# Patient Record
Sex: Male | Born: 1945 | Race: White | Hispanic: No | Marital: Married | Smoking: Former smoker
Health system: Southern US, Community
[De-identification: ages and names within clinical notes are randomized; demographics above are authoritative.]

## PROBLEM LIST (undated history)

## (undated) DIAGNOSIS — S71139A Puncture wound without foreign body, unspecified thigh, initial encounter: Secondary | ICD-10-CM

## (undated) DIAGNOSIS — C801 Malignant (primary) neoplasm, unspecified: Secondary | ICD-10-CM

## (undated) DIAGNOSIS — J449 Chronic obstructive pulmonary disease, unspecified: Secondary | ICD-10-CM

## (undated) DIAGNOSIS — S72142A Displaced intertrochanteric fracture of left femur, initial encounter for closed fracture: Secondary | ICD-10-CM

## (undated) DIAGNOSIS — M1990 Unspecified osteoarthritis, unspecified site: Secondary | ICD-10-CM

## (undated) DIAGNOSIS — J45909 Unspecified asthma, uncomplicated: Secondary | ICD-10-CM

## (undated) DIAGNOSIS — W3400XA Accidental discharge from unspecified firearms or gun, initial encounter: Secondary | ICD-10-CM

## (undated) DIAGNOSIS — S99929A Unspecified injury of unspecified foot, initial encounter: Secondary | ICD-10-CM

## (undated) HISTORY — PX: NASAL SINUS SURGERY: SHX719

## (undated) HISTORY — PX: BACK SURGERY: SHX140

## (undated) HISTORY — PX: HIP SURGERY: SHX245

## (undated) HISTORY — PX: OTHER SURGICAL HISTORY: SHX169

---

## 2004-10-16 ENCOUNTER — Ambulatory Visit: Payer: Self-pay

## 2004-11-03 ENCOUNTER — Ambulatory Visit: Payer: Self-pay

## 2005-06-16 ENCOUNTER — Ambulatory Visit: Payer: Self-pay | Admitting: Ophthalmology

## 2007-11-27 ENCOUNTER — Other Ambulatory Visit: Payer: Self-pay

## 2007-11-27 ENCOUNTER — Emergency Department: Payer: Self-pay | Admitting: Emergency Medicine

## 2008-03-21 ENCOUNTER — Ambulatory Visit: Payer: Self-pay | Admitting: Urology

## 2008-03-26 ENCOUNTER — Ambulatory Visit: Payer: Self-pay | Admitting: Urology

## 2009-02-22 ENCOUNTER — Ambulatory Visit: Payer: Self-pay | Admitting: Internal Medicine

## 2009-05-30 ENCOUNTER — Ambulatory Visit: Payer: Self-pay | Admitting: Physician Assistant

## 2009-06-11 ENCOUNTER — Ambulatory Visit: Payer: Self-pay | Admitting: Pain Medicine

## 2009-06-25 ENCOUNTER — Ambulatory Visit: Payer: Self-pay | Admitting: Pain Medicine

## 2009-07-16 ENCOUNTER — Ambulatory Visit: Payer: Self-pay | Admitting: Pain Medicine

## 2009-07-30 ENCOUNTER — Ambulatory Visit: Payer: Self-pay | Admitting: Pain Medicine

## 2014-01-31 DIAGNOSIS — I10 Essential (primary) hypertension: Secondary | ICD-10-CM | POA: Insufficient documentation

## 2014-01-31 DIAGNOSIS — J449 Chronic obstructive pulmonary disease, unspecified: Secondary | ICD-10-CM | POA: Insufficient documentation

## 2014-01-31 DIAGNOSIS — E785 Hyperlipidemia, unspecified: Secondary | ICD-10-CM | POA: Insufficient documentation

## 2014-02-27 DIAGNOSIS — G479 Sleep disorder, unspecified: Secondary | ICD-10-CM | POA: Insufficient documentation

## 2014-02-27 DIAGNOSIS — N400 Enlarged prostate without lower urinary tract symptoms: Secondary | ICD-10-CM | POA: Insufficient documentation

## 2014-07-05 ENCOUNTER — Inpatient Hospital Stay (HOSPITAL_COMMUNITY)
Admission: EM | Admit: 2014-07-05 | Discharge: 2014-07-10 | DRG: 481 | Disposition: A | Discharge status: Home Health | Payer: Medicare Other | Attending: Family Medicine | Admitting: Family Medicine

## 2014-07-05 ENCOUNTER — Encounter (HOSPITAL_COMMUNITY): Payer: Self-pay | Admitting: Emergency Medicine

## 2014-07-05 ENCOUNTER — Emergency Department (HOSPITAL_COMMUNITY): Payer: Medicare Other

## 2014-07-05 DIAGNOSIS — S72142A Displaced intertrochanteric fracture of left femur, initial encounter for closed fracture: Principal | ICD-10-CM | POA: Diagnosis present

## 2014-07-05 DIAGNOSIS — Z8546 Personal history of malignant neoplasm of prostate: Secondary | ICD-10-CM

## 2014-07-05 DIAGNOSIS — R042 Hemoptysis: Secondary | ICD-10-CM | POA: Diagnosis present

## 2014-07-05 DIAGNOSIS — M810 Age-related osteoporosis without current pathological fracture: Secondary | ICD-10-CM | POA: Diagnosis present

## 2014-07-05 DIAGNOSIS — Z87891 Personal history of nicotine dependence: Secondary | ICD-10-CM | POA: Diagnosis not present

## 2014-07-05 DIAGNOSIS — R05 Cough: Secondary | ICD-10-CM | POA: Diagnosis present

## 2014-07-05 DIAGNOSIS — D682 Hereditary deficiency of other clotting factors: Secondary | ICD-10-CM | POA: Diagnosis present

## 2014-07-05 DIAGNOSIS — E871 Hypo-osmolality and hyponatremia: Secondary | ICD-10-CM | POA: Diagnosis present

## 2014-07-05 DIAGNOSIS — M5020 Other cervical disc displacement, unspecified cervical region: Secondary | ICD-10-CM | POA: Diagnosis present

## 2014-07-05 DIAGNOSIS — Z01810 Encounter for preprocedural cardiovascular examination: Secondary | ICD-10-CM

## 2014-07-05 DIAGNOSIS — T148XXA Other injury of unspecified body region, initial encounter: Secondary | ICD-10-CM

## 2014-07-05 DIAGNOSIS — J45901 Unspecified asthma with (acute) exacerbation: Secondary | ICD-10-CM | POA: Diagnosis present

## 2014-07-05 DIAGNOSIS — I452 Bifascicular block: Secondary | ICD-10-CM | POA: Diagnosis present

## 2014-07-05 DIAGNOSIS — M4802 Spinal stenosis, cervical region: Secondary | ICD-10-CM | POA: Diagnosis present

## 2014-07-05 DIAGNOSIS — D62 Acute posthemorrhagic anemia: Secondary | ICD-10-CM | POA: Diagnosis not present

## 2014-07-05 DIAGNOSIS — W1830XA Fall on same level, unspecified, initial encounter: Secondary | ICD-10-CM | POA: Diagnosis present

## 2014-07-05 DIAGNOSIS — R059 Cough, unspecified: Secondary | ICD-10-CM

## 2014-07-05 DIAGNOSIS — R55 Syncope and collapse: Secondary | ICD-10-CM | POA: Diagnosis present

## 2014-07-05 DIAGNOSIS — Z87828 Personal history of other (healed) physical injury and trauma: Secondary | ICD-10-CM | POA: Diagnosis not present

## 2014-07-05 DIAGNOSIS — R1310 Dysphagia, unspecified: Secondary | ICD-10-CM | POA: Diagnosis present

## 2014-07-05 DIAGNOSIS — S72002A Fracture of unspecified part of neck of left femur, initial encounter for closed fracture: Secondary | ICD-10-CM

## 2014-07-05 DIAGNOSIS — J441 Chronic obstructive pulmonary disease with (acute) exacerbation: Secondary | ICD-10-CM | POA: Diagnosis present

## 2014-07-05 DIAGNOSIS — R054 Cough syncope: Secondary | ICD-10-CM

## 2014-07-05 DIAGNOSIS — E860 Dehydration: Secondary | ICD-10-CM | POA: Diagnosis present

## 2014-07-05 HISTORY — DX: Displaced intertrochanteric fracture of left femur, initial encounter for closed fracture: S72.142A

## 2014-07-05 HISTORY — DX: Chronic obstructive pulmonary disease, unspecified: J44.9

## 2014-07-05 HISTORY — DX: Puncture wound without foreign body, unspecified thigh, initial encounter: S71.139A

## 2014-07-05 HISTORY — DX: Malignant (primary) neoplasm, unspecified: C80.1

## 2014-07-05 HISTORY — DX: Unspecified asthma, uncomplicated: J45.909

## 2014-07-05 HISTORY — DX: Accidental discharge from unspecified firearms or gun, initial encounter: W34.00XA

## 2014-07-05 HISTORY — DX: Unspecified osteoarthritis, unspecified site: M19.90

## 2014-07-05 HISTORY — DX: Unspecified injury of unspecified foot, initial encounter: S99.929A

## 2014-07-05 LAB — CBC WITH DIFFERENTIAL/PLATELET
BASOS ABS: 0 10*3/uL (ref 0.0–0.1)
Basophils Relative: 0 % (ref 0–1)
Eosinophils Absolute: 0.1 10*3/uL (ref 0.0–0.7)
Eosinophils Relative: 1 % (ref 0–5)
HCT: 39.7 % (ref 39.0–52.0)
Hemoglobin: 13.3 g/dL (ref 13.0–17.0)
LYMPHS ABS: 1.4 10*3/uL (ref 0.7–4.0)
LYMPHS PCT: 9 % — AB (ref 12–46)
MCH: 28.7 pg (ref 26.0–34.0)
MCHC: 33.5 g/dL (ref 30.0–36.0)
MCV: 85.6 fL (ref 78.0–100.0)
Monocytes Absolute: 0.9 10*3/uL (ref 0.1–1.0)
Monocytes Relative: 6 % (ref 3–12)
NEUTROS ABS: 13.1 10*3/uL — AB (ref 1.7–7.7)
Neutrophils Relative %: 84 % — ABNORMAL HIGH (ref 43–77)
PLATELETS: 266 10*3/uL (ref 150–400)
RBC: 4.64 MIL/uL (ref 4.22–5.81)
RDW: 13.8 % (ref 11.5–15.5)
WBC: 15.5 10*3/uL — AB (ref 4.0–10.5)

## 2014-07-05 LAB — TYPE AND SCREEN
ABO/RH(D): O POS
ANTIBODY SCREEN: NEGATIVE

## 2014-07-05 LAB — BASIC METABOLIC PANEL
ANION GAP: 14 (ref 5–15)
BUN: 16 mg/dL (ref 6–23)
CALCIUM: 9 mg/dL (ref 8.4–10.5)
CO2: 22 meq/L (ref 19–32)
Chloride: 100 mEq/L (ref 96–112)
Creatinine, Ser: 1.06 mg/dL (ref 0.50–1.35)
GFR calc Af Amer: 80 mL/min — ABNORMAL LOW (ref >90)
GFR calc non Af Amer: 69 mL/min — ABNORMAL LOW (ref >90)
Glucose, Bld: 107 mg/dL — ABNORMAL HIGH (ref 70–99)
POTASSIUM: 4.6 meq/L (ref 3.7–5.3)
SODIUM: 136 meq/L — AB (ref 137–147)

## 2014-07-05 LAB — ABO/RH: ABO/RH(D): O POS

## 2014-07-05 LAB — PROTIME-INR
INR: 1.11 (ref 0.00–1.49)
PROTHROMBIN TIME: 14.4 s (ref 11.6–15.2)

## 2014-07-05 IMAGING — CR DG HIP (WITH OR WITHOUT PELVIS) 2-3V*L*
1 series · 1 of 1 positions shown · non-contrast
Comparison: None.

CLINICAL DATA: Fall on left hip, left hip pain

EXAM:
LEFT HIP - COMPLETE 2+ VIEW

[t hip frog leg left]
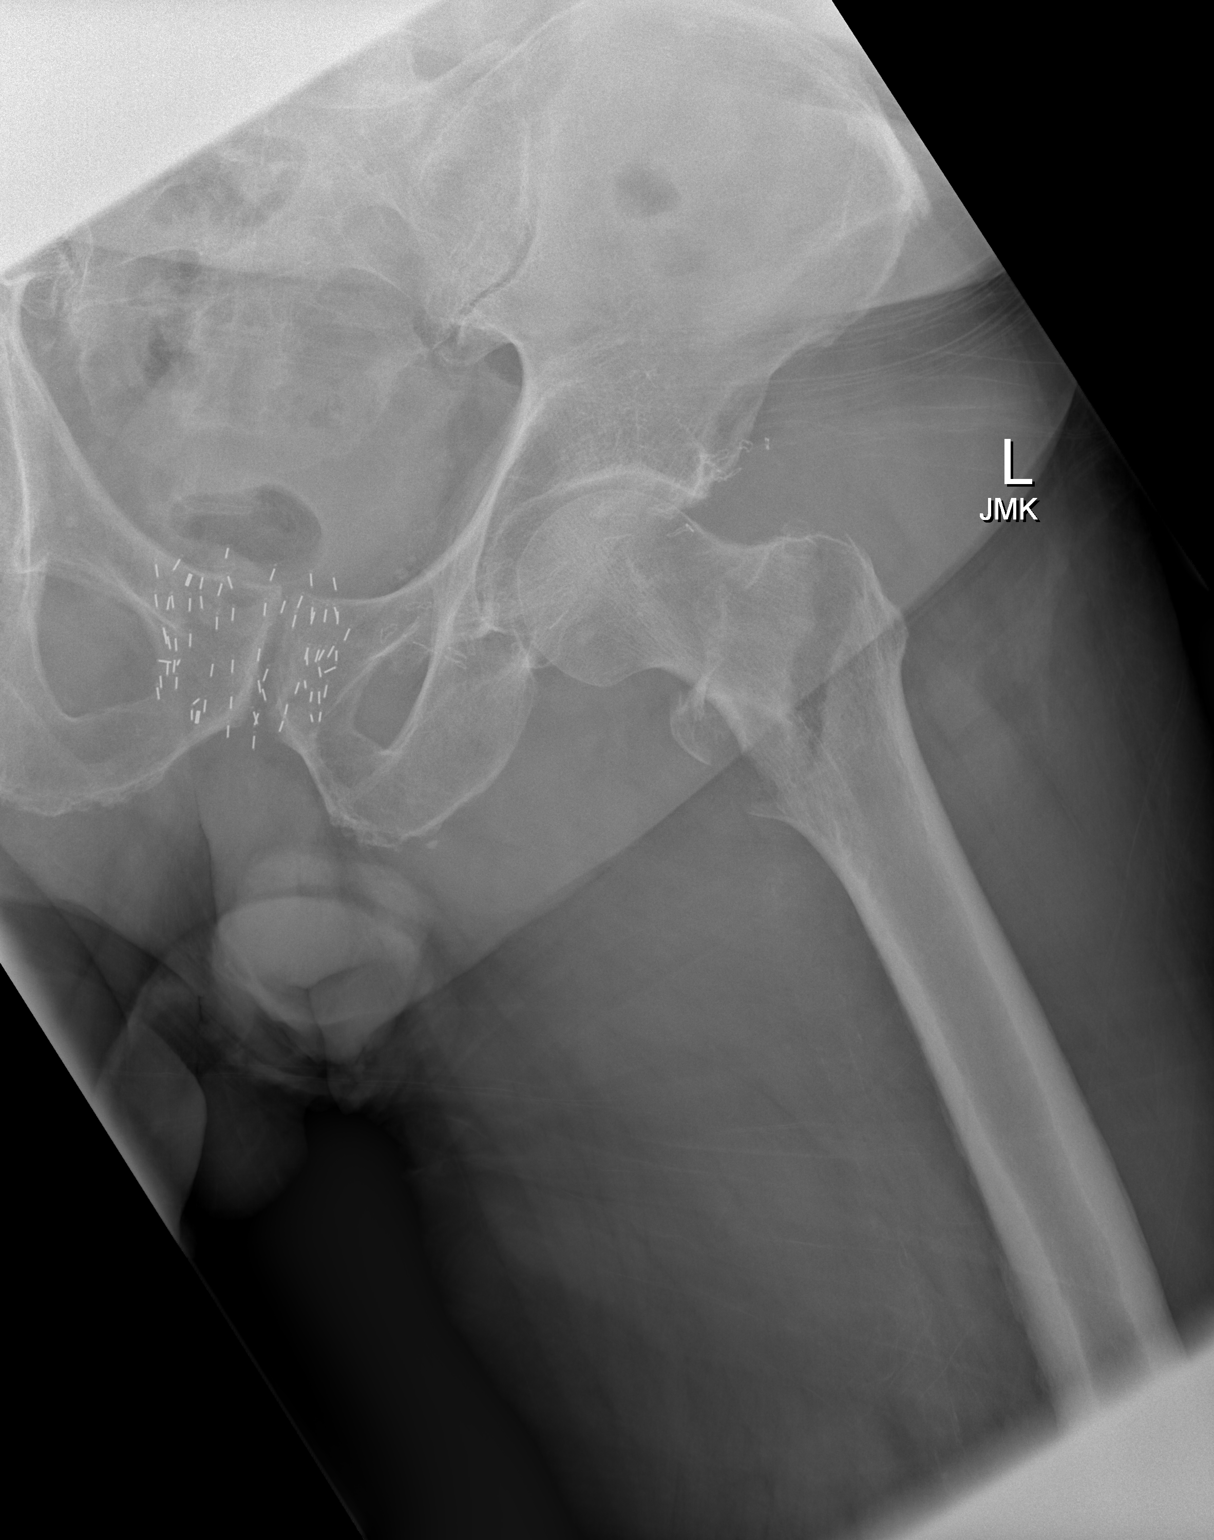

[1 of 1 positions shown; findings below may reference images not displayed]

FINDINGS: Intertrochanteric left hip fracture, minimally displaced.

Mildly displaced lesser trochanteric fragment.

Visualized bony pelvis appears intact.

Brachytherapy seeds overlying the prostate.
IMPRESSION: Intertrochanteric left hip fracture, as above.

## 2014-07-05 MED ORDER — HYDROMORPHONE HCL 1 MG/ML IJ SOLN
0.5000 mg | INTRAMUSCULAR | Status: AC | PRN
  Administered 2014-07-05 (×2): 0.5 mg via INTRAVENOUS
  Filled 2014-07-05 (×2): qty 1

## 2014-07-05 MED ORDER — ALBUTEROL SULFATE (2.5 MG/3ML) 0.083% IN NEBU
2.5000 mg | INHALATION_SOLUTION | Freq: Three times a day (TID) | RESPIRATORY_TRACT | Status: DC
  Administered 2014-07-06 – 2014-07-10 (×13): 2.5 mg via RESPIRATORY_TRACT
  Filled 2014-07-05 (×13): qty 3

## 2014-07-05 MED ORDER — MORPHINE SULFATE 4 MG/ML IJ SOLN
4.0000 mg | INTRAMUSCULAR | Status: DC | PRN
Start: 2014-07-05 — End: 2014-07-06
  Administered 2014-07-06 (×6): 4 mg via INTRAVENOUS
  Filled 2014-07-05 (×6): qty 1

## 2014-07-05 MED ORDER — DOXYCYCLINE HYCLATE 100 MG PO TABS
100.0000 mg | ORAL_TABLET | Freq: Two times a day (BID) | ORAL | Status: DC
  Administered 2014-07-06 – 2014-07-10 (×9): 100 mg via ORAL
  Filled 2014-07-05 (×11): qty 1

## 2014-07-05 MED ORDER — CEFAZOLIN SODIUM-DEXTROSE 2-3 GM-% IV SOLR
2.0000 g | INTRAVENOUS | Status: AC
  Administered 2014-07-06: 2 g via INTRAVENOUS
  Filled 2014-07-05: qty 50

## 2014-07-05 MED ORDER — SODIUM CHLORIDE 0.9 % IJ SOLN: 3.0000 mL | Freq: Two times a day (BID) | INTRAMUSCULAR | Status: DC

## 2014-07-05 MED ORDER — ONDANSETRON HCL 4 MG/2ML IJ SOLN
4.0000 mg | Freq: Four times a day (QID) | INTRAMUSCULAR | Status: DC | PRN
  Administered 2014-07-06 (×2): 4 mg via INTRAVENOUS
  Filled 2014-07-05 (×2): qty 2

## 2014-07-05 MED ORDER — SODIUM CHLORIDE 0.9 % IV SOLN
INTRAVENOUS | Status: DC
  Administered 2014-07-06: 01:00:00 via INTRAVENOUS

## 2014-07-05 MED ORDER — MONTELUKAST SODIUM 10 MG PO TABS
10.0000 mg | ORAL_TABLET | Freq: Every day | ORAL | Status: DC
  Administered 2014-07-06 – 2014-07-09 (×5): 10 mg via ORAL
  Filled 2014-07-05 (×6): qty 1

## 2014-07-05 MED ORDER — FINASTERIDE 5 MG PO TABS
5.0000 mg | ORAL_TABLET | Freq: Every evening | ORAL | Status: DC
  Administered 2014-07-06 – 2014-07-09 (×4): 5 mg via ORAL
  Filled 2014-07-05 (×6): qty 1

## 2014-07-05 MED ORDER — ONDANSETRON HCL 4 MG/2ML IJ SOLN
4.0000 mg | Freq: Once | INTRAMUSCULAR | Status: AC
  Administered 2014-07-05: 4 mg via INTRAVENOUS
  Filled 2014-07-05: qty 2

## 2014-07-05 MED ORDER — PREDNISONE 50 MG PO TABS
50.0000 mg | ORAL_TABLET | Freq: Every day | ORAL | Status: DC
  Filled 2014-07-05: qty 1

## 2014-07-05 MED ORDER — ONDANSETRON HCL 4 MG/2ML IJ SOLN
4.0000 mg | Freq: Three times a day (TID) | INTRAMUSCULAR | Status: DC | PRN
Start: 2014-07-05 — End: 2014-07-05

## 2014-07-05 MED ORDER — ALBUTEROL SULFATE (2.5 MG/3ML) 0.083% IN NEBU
2.5000 mg | INHALATION_SOLUTION | Freq: Four times a day (QID) | RESPIRATORY_TRACT | Status: DC
  Administered 2014-07-05: 2.5 mg via RESPIRATORY_TRACT
  Filled 2014-07-05: qty 3

## 2014-07-05 MED ORDER — HYDROMORPHONE HCL 1 MG/ML IJ SOLN
1.0000 mg | INTRAMUSCULAR | Status: DC | PRN
  Administered 2014-07-05: 1 mg via INTRAVENOUS
  Filled 2014-07-05: qty 1

## 2014-07-05 MED ORDER — HYDROCODONE-ACETAMINOPHEN 5-325 MG PO TABS: 1.0000 | ORAL_TABLET | ORAL | Status: DC | PRN

## 2014-07-05 MED ORDER — ALBUTEROL SULFATE (2.5 MG/3ML) 0.083% IN NEBU: 2.5000 mg | INHALATION_SOLUTION | RESPIRATORY_TRACT | Status: DC | PRN

## 2014-07-05 MED ORDER — HYDROMORPHONE HCL 1 MG/ML IJ SOLN: 1.0000 mg | INTRAMUSCULAR | Status: DC | PRN

## 2014-07-05 MED ORDER — ONDANSETRON HCL 4 MG PO TABS: 4.0000 mg | ORAL_TABLET | Freq: Four times a day (QID) | ORAL | Status: DC | PRN

## 2014-07-05 MED ORDER — DEXTROSE-NACL 5-0.45 % IV SOLN: 100.0000 mL/h | INTRAVENOUS | Status: DC

## 2014-07-05 MED ORDER — TAMSULOSIN HCL 0.4 MG PO CAPS
0.4000 mg | ORAL_CAPSULE | Freq: Every day | ORAL | Status: DC
  Administered 2014-07-07 – 2014-07-10 (×4): 0.4 mg via ORAL
  Filled 2014-07-05 (×5): qty 1

## 2014-07-05 MED ORDER — ACETAMINOPHEN 500 MG PO TABS
1000.0000 mg | ORAL_TABLET | Freq: Once | ORAL | Status: AC
  Administered 2014-07-06: 1000 mg via ORAL
  Filled 2014-07-05: qty 2

## 2014-07-05 MED ORDER — POTASSIUM CHLORIDE IN NACL 20-0.45 MEQ/L-% IV SOLN
INTRAVENOUS | Status: DC
  Filled 2014-07-05 (×2): qty 1000

## 2014-07-05 MED ORDER — GABAPENTIN 300 MG PO CAPS
600.0000 mg | ORAL_CAPSULE | Freq: Three times a day (TID) | ORAL | Status: DC
  Administered 2014-07-06 – 2014-07-10 (×12): 600 mg via ORAL
  Filled 2014-07-05 (×16): qty 2
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
## 2014-07-05 NOTE — Consult Note (Signed)
I have reviewed his films and discussed the case with Dr. Mardelle Matte.   I have spoken with the patient over the phone.   Current plan is for [facility]am [facility] to admit patient and clear for surgery tomorrow mid day.  -Bedrest overnight  -NPO at midnight -hold any anticoagulation until patient is post op.   Dr. Mardelle Matte will meet Mr. Calvin [name] tomorrow before surgery and perform his surgery.     Edmonia Lynch, D, MD Cell [phone]   07/05/2014 8:01 PM

## 2014-07-05 NOTE — ED Notes (Signed)
Per EMS, Patient had a fall this morning due to lightheadedness after a coughing episode at 10:30a. Patient was unable to get himself up. A neighbor came over and retrieved the patient around 1600. EMS was called at this point. Patient has pain to the left knee, pulses present, but patient reports extreme pain during movement or Range of Motion. EMS did not note any rotation upon arrival. Patient denies LOC and is alert and oriented x4 upon arrival to the ED. Vitals per EMS: 150/85, 108 HR, 18, 97 % on RA.

## 2014-07-05 NOTE — ED Notes (Signed)
Pt reports coughing up blood.  Blood noted on napkin. Pt sts this has been going on for 3 months.  Stated that he told admitting doctors about hemoptysis.
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
## 2014-07-06 ENCOUNTER — Encounter (HOSPITAL_COMMUNITY): Payer: Medicare Other | Admitting: Certified Registered Nurse Anesthetist

## 2014-07-06 ENCOUNTER — Encounter (HOSPITAL_COMMUNITY)
Admission: EM | Disposition: A | Discharge status: Home Health | Payer: Self-pay | Source: Home / Self Care | Attending: Family Medicine

## 2014-07-06 ENCOUNTER — Encounter (HOSPITAL_COMMUNITY): Payer: Self-pay | Admitting: Physician Assistant

## 2014-07-06 ENCOUNTER — Inpatient Hospital Stay (HOSPITAL_COMMUNITY): Payer: Medicare Other

## 2014-07-06 ENCOUNTER — Inpatient Hospital Stay (HOSPITAL_COMMUNITY): Payer: Medicare Other | Admitting: Certified Registered Nurse Anesthetist

## 2014-07-06 DIAGNOSIS — R05 Cough: Secondary | ICD-10-CM

## 2014-07-06 DIAGNOSIS — R054 Cough syncope: Secondary | ICD-10-CM | POA: Diagnosis present

## 2014-07-06 DIAGNOSIS — R042 Hemoptysis: Secondary | ICD-10-CM | POA: Diagnosis present

## 2014-07-06 DIAGNOSIS — S72002A Fracture of unspecified part of neck of left femur, initial encounter for closed fracture: Secondary | ICD-10-CM

## 2014-07-06 DIAGNOSIS — I452 Bifascicular block: Secondary | ICD-10-CM | POA: Diagnosis present

## 2014-07-06 DIAGNOSIS — M810 Age-related osteoporosis without current pathological fracture: Secondary | ICD-10-CM | POA: Diagnosis present

## 2014-07-06 DIAGNOSIS — S72142A Displaced intertrochanteric fracture of left femur, initial encounter for closed fracture: Principal | ICD-10-CM

## 2014-07-06 DIAGNOSIS — Z01810 Encounter for preprocedural cardiovascular examination: Secondary | ICD-10-CM

## 2014-07-06 HISTORY — PX: INTRAMEDULLARY (IM) NAIL INTERTROCHANTERIC: SHX5875

## 2014-07-06 LAB — COMPREHENSIVE METABOLIC PANEL
ALT: 9 U/L (ref 0–53)
ANION GAP: 9 (ref 5–15)
AST: 13 U/L (ref 0–37)
Albumin: 3.1 g/dL — ABNORMAL LOW (ref 3.5–5.2)
Alkaline Phosphatase: 63 U/L (ref 39–117)
BUN: 19 mg/dL (ref 6–23)
CALCIUM: 8.6 mg/dL (ref 8.4–10.5)
CO2: 28 mEq/L (ref 19–32)
Chloride: 102 mEq/L (ref 96–112)
Creatinine, Ser: 1.19 mg/dL (ref 0.50–1.35)
GFR calc Af Amer: 70 mL/min — ABNORMAL LOW (ref >90)
GFR calc non Af Amer: 60 mL/min — ABNORMAL LOW (ref >90)
Glucose, Bld: 137 mg/dL — ABNORMAL HIGH (ref 70–99)
POTASSIUM: 4.9 meq/L (ref 3.7–5.3)
Sodium: 139 mEq/L (ref 137–147)
TOTAL PROTEIN: 7 g/dL (ref 6.0–8.3)
Total Bilirubin: 0.5 mg/dL (ref 0.3–1.2)

## 2014-07-06 LAB — CBC
HEMATOCRIT: 39.2 % (ref 39.0–52.0)
HEMOGLOBIN: 12.8 g/dL — AB (ref 13.0–17.0)
MCH: 28.7 pg (ref 26.0–34.0)
MCHC: 32.7 g/dL (ref 30.0–36.0)
MCV: 87.9 fL (ref 78.0–100.0)
Platelets: 271 10*3/uL (ref 150–400)
RBC: 4.46 MIL/uL (ref 4.22–5.81)
RDW: 13.9 % (ref 11.5–15.5)
WBC: 11.8 10*3/uL — ABNORMAL HIGH (ref 4.0–10.5)

## 2014-07-06 SURGERY — FIXATION, FRACTURE, INTERTROCHANTERIC, WITH INTRAMEDULLARY ROD
Anesthesia: General | Site: Leg Upper | Laterality: Left

## 2014-07-06 MED ORDER — LIDOCAINE HCL (CARDIAC) 20 MG/ML IV SOLN
INTRAVENOUS | Status: AC
  Filled 2014-07-06: qty 5

## 2014-07-06 MED ORDER — ENOXAPARIN SODIUM 40 MG/0.4ML ~~LOC~~ SOLN
40.0000 mg | SUBCUTANEOUS | Status: DC
  Administered 2014-07-07 – 2014-07-10 (×4): 40 mg via SUBCUTANEOUS
  Filled 2014-07-06 (×6): qty 0.4

## 2014-07-06 MED ORDER — ACETAMINOPHEN 500 MG PO TABS: 500.0000 mg | ORAL_TABLET | Freq: Four times a day (QID) | ORAL | Status: DC

## 2014-07-06 MED ORDER — CEFAZOLIN SODIUM-DEXTROSE 2-3 GM-% IV SOLR
2.0000 g | Freq: Four times a day (QID) | INTRAVENOUS | Status: AC
  Administered 2014-07-06 – 2014-07-07 (×2): 2 g via INTRAVENOUS
  Filled 2014-07-06 (×2): qty 50

## 2014-07-06 MED ORDER — FENTANYL CITRATE 0.05 MG/ML IJ SOLN
INTRAMUSCULAR | Status: DC | PRN
  Administered 2014-07-06: 50 ug via INTRAVENOUS
  Administered 2014-07-06: 150 ug via INTRAVENOUS
  Administered 2014-07-06: 50 ug via INTRAVENOUS

## 2014-07-06 MED ORDER — PROPOFOL 10 MG/ML IV BOLUS
INTRAVENOUS | Status: DC | PRN
  Administered 2014-07-06: 130 mg via INTRAVENOUS

## 2014-07-06 MED ORDER — HYDROCODONE-ACETAMINOPHEN 10-325 MG PO TABS: 1.0000 | ORAL_TABLET | Freq: Four times a day (QID) | ORAL | Status: DC | PRN

## 2014-07-06 MED ORDER — METOCLOPRAMIDE HCL 5 MG/ML IJ SOLN: 5.0000 mg | Freq: Three times a day (TID) | INTRAMUSCULAR | Status: DC | PRN

## 2014-07-06 MED ORDER — METHYLPREDNISOLONE SODIUM SUCC 125 MG IJ SOLR: 60.0000 mg | Freq: Once | INTRAMUSCULAR | Status: DC

## 2014-07-06 MED ORDER — PHENOL 1.4 % MT LIQD
1.0000 | OROMUCOSAL | Status: DC | PRN
Start: 2014-07-06 — End: 2014-07-10

## 2014-07-06 MED ORDER — BENZONATATE 100 MG PO CAPS: 100.0000 mg | ORAL_CAPSULE | Freq: Three times a day (TID) | ORAL | Status: DC | PRN

## 2014-07-06 MED ORDER — ONDANSETRON HCL 4 MG/2ML IJ SOLN: 4.0000 mg | Freq: Once | INTRAMUSCULAR | Status: DC | PRN

## 2014-07-06 MED ORDER — MENTHOL 3 MG MT LOZG: 1.0000 | LOZENGE | OROMUCOSAL | Status: DC | PRN

## 2014-07-06 MED ORDER — BENZONATATE 100 MG PO CAPS: 100.0000 mg | ORAL_CAPSULE | Freq: Two times a day (BID) | ORAL | Status: DC

## 2014-07-06 MED ORDER — MAGNESIUM CITRATE PO SOLN: 1.0000 | Freq: Once | ORAL | Status: AC | PRN

## 2014-07-06 MED ORDER — ACETAMINOPHEN 650 MG RE SUPP: 650.0000 mg | Freq: Four times a day (QID) | RECTAL | Status: DC | PRN

## 2014-07-06 MED ORDER — ENOXAPARIN SODIUM 40 MG/0.4ML ~~LOC~~ SOLN: 40.0000 mg | SUBCUTANEOUS | Status: DC

## 2014-07-06 MED ORDER — LACTATED RINGERS IV SOLN
INTRAVENOUS | Status: DC
  Administered 2014-07-06: 16:00:00 via INTRAVENOUS

## 2014-07-06 MED ORDER — PREDNISONE 50 MG PO TABS: 50.0000 mg | ORAL_TABLET | Freq: Every day | ORAL | Status: DC

## 2014-07-06 MED ORDER — HYDROMORPHONE HCL 1 MG/ML IJ SOLN
INTRAMUSCULAR | Status: AC
  Filled 2014-07-06: qty 1

## 2014-07-06 MED ORDER — ROCURONIUM BROMIDE 50 MG/5ML IV SOLN
INTRAVENOUS | Status: AC
  Filled 2014-07-06: qty 1

## 2014-07-06 MED ORDER — POLYETHYLENE GLYCOL 3350 17 G PO PACK
17.0000 g | PACK | Freq: Every day | ORAL | Status: DC | PRN
Start: 2014-07-06 — End: 2014-07-09
  Administered 2014-07-08: 17 g via ORAL

## 2014-07-06 MED ORDER — METOCLOPRAMIDE HCL 10 MG PO TABS
5.0000 mg | ORAL_TABLET | Freq: Three times a day (TID) | ORAL | Status: DC | PRN
  Administered 2014-07-08 – 2014-07-09 (×2): 10 mg via ORAL
  Filled 2014-07-06 (×2): qty 1

## 2014-07-06 MED ORDER — MORPHINE SULFATE 2 MG/ML IJ SOLN
0.5000 mg | INTRAMUSCULAR | Status: DC | PRN
  Filled 2014-07-06: qty 1

## 2014-07-06 MED ORDER — ONDANSETRON HCL 4 MG PO TABS
4.0000 mg | ORAL_TABLET | Freq: Four times a day (QID) | ORAL | Status: DC | PRN
Start: 2014-07-06 — End: 2014-07-10

## 2014-07-06 MED ORDER — PROPOFOL 10 MG/ML IV BOLUS
INTRAVENOUS | Status: AC
  Filled 2014-07-06: qty 20

## 2014-07-06 MED ORDER — SENNA 8.6 MG PO TABS
2.0000 | ORAL_TABLET | Freq: Every day | ORAL | Status: DC
  Filled 2014-07-06: qty 2

## 2014-07-06 MED ORDER — ONDANSETRON HCL 4 MG/2ML IJ SOLN
4.0000 mg | Freq: Four times a day (QID) | INTRAMUSCULAR | Status: DC | PRN
  Administered 2014-07-07 (×2): 4 mg via INTRAVENOUS
  Filled 2014-07-06 (×2): qty 2

## 2014-07-06 MED ORDER — PHENYLEPHRINE 40 MCG/ML (10ML) SYRINGE FOR IV PUSH (FOR BLOOD PRESSURE SUPPORT)
PREFILLED_SYRINGE | INTRAVENOUS | Status: AC
  Filled 2014-07-06: qty 10

## 2014-07-06 MED ORDER — PREDNISONE 50 MG PO TABS
50.0000 mg | ORAL_TABLET | Freq: Every day | ORAL | Status: DC
  Administered 2014-07-06 – 2014-07-10 (×5): 50 mg via ORAL
  Filled 2014-07-06 (×6): qty 1

## 2014-07-06 MED ORDER — DOCUSATE SODIUM 100 MG PO CAPS
100.0000 mg | ORAL_CAPSULE | Freq: Two times a day (BID) | ORAL | Status: DC
  Administered 2014-07-06 – 2014-07-10 (×8): 100 mg via ORAL
  Filled 2014-07-06 (×8): qty 1

## 2014-07-06 MED ORDER — LIDOCAINE HCL (CARDIAC) 20 MG/ML IV SOLN
INTRAVENOUS | Status: DC | PRN
  Administered 2014-07-06: 80 mg via INTRAVENOUS

## 2014-07-06 MED ORDER — SENNA-DOCUSATE SODIUM 8.6-50 MG PO TABS: 2.0000 | ORAL_TABLET | Freq: Every day | ORAL | Status: DC

## 2014-07-06 MED ORDER — DEXAMETHASONE SODIUM PHOSPHATE 10 MG/ML IJ SOLN
INTRAMUSCULAR | Status: AC
  Filled 2014-07-06: qty 1

## 2014-07-06 MED ORDER — ROCURONIUM BROMIDE 100 MG/10ML IV SOLN
INTRAVENOUS | Status: DC | PRN
  Administered 2014-07-06: 30 mg via INTRAVENOUS

## 2014-07-06 MED ORDER — FENTANYL CITRATE 0.05 MG/ML IJ SOLN
INTRAMUSCULAR | Status: AC
  Filled 2014-07-06: qty 5

## 2014-07-06 MED ORDER — MOMETASONE FURO-FORMOTEROL FUM 100-5 MCG/ACT IN AERO
2.0000 | INHALATION_SPRAY | Freq: Two times a day (BID) | RESPIRATORY_TRACT | Status: DC
  Administered 2014-07-06 – 2014-07-10 (×8): 2 via RESPIRATORY_TRACT
  Filled 2014-07-06: qty 8.8

## 2014-07-06 MED ORDER — ACETAMINOPHEN 325 MG PO TABS: 650.0000 mg | ORAL_TABLET | Freq: Four times a day (QID) | ORAL | Status: DC | PRN

## 2014-07-06 MED ORDER — BISACODYL 10 MG RE SUPP
10.0000 mg | Freq: Every day | RECTAL | Status: DC | PRN
  Filled 2014-07-06: qty 1

## 2014-07-06 MED ORDER — HYDROMORPHONE HCL 1 MG/ML IJ SOLN
0.2500 mg | INTRAMUSCULAR | Status: DC | PRN
  Administered 2014-07-06 (×4): 0.5 mg via INTRAVENOUS

## 2014-07-06 MED ORDER — 0.9 % SODIUM CHLORIDE (POUR BTL) OPTIME
TOPICAL | Status: DC | PRN
  Administered 2014-07-06: 1000 mL

## 2014-07-06 MED ORDER — SUCCINYLCHOLINE CHLORIDE 20 MG/ML IJ SOLN
INTRAMUSCULAR | Status: AC
  Filled 2014-07-06: qty 1

## 2014-07-06 MED ORDER — POTASSIUM CHLORIDE IN NACL 20-0.45 MEQ/L-% IV SOLN
INTRAVENOUS | Status: DC
  Administered 2014-07-06: 22:00:00 via INTRAVENOUS
  Filled 2014-07-06 (×6): qty 1000

## 2014-07-06 MED ORDER — ALUM & MAG HYDROXIDE-SIMETH 200-200-20 MG/5ML PO SUSP
30.0000 mL | ORAL | Status: DC | PRN
  Administered 2014-07-07 – 2014-07-08 (×3): 30 mL via ORAL
  Filled 2014-07-06 (×3): qty 30

## 2014-07-06 MED ORDER — OXYCODONE HCL 5 MG PO TABS: 5.0000 mg | ORAL_TABLET | ORAL | Status: DC | PRN

## 2014-07-06 MED ORDER — SENNA 8.6 MG PO TABS
1.0000 | ORAL_TABLET | Freq: Two times a day (BID) | ORAL | Status: DC
  Administered 2014-07-06 – 2014-07-08 (×5): 8.6 mg via ORAL
  Filled 2014-07-06 (×7): qty 1

## 2014-07-06 MED ORDER — HYDROCODONE-ACETAMINOPHEN 5-325 MG PO TABS
1.0000 | ORAL_TABLET | Freq: Four times a day (QID) | ORAL | Status: DC | PRN
  Administered 2014-07-06 – 2014-07-10 (×14): 2 via ORAL
  Filled 2014-07-06 (×14): qty 2

## 2014-07-06 SURGICAL SUPPLY — 44 items
BENZOIN TINCTURE PRP APPL 2/3 (GAUZE/BANDAGES/DRESSINGS) ×3 IMPLANT
BIT DRILL CANN 16 HIP (BIT) ×2 IMPLANT
BIT DRILL CANN 16MM HIP (BIT) ×1
BLADE TFNA HELICAL 110 (Anchor) ×2 IMPLANT
BLADE TFNA HELICAL 110MM (Anchor) ×1 IMPLANT
BOOTCOVER CLEANROOM LRG (PROTECTIVE WEAR) ×6 IMPLANT
CLOSURE STERI-STRIP 1/2X4 (GAUZE/BANDAGES/DRESSINGS) ×1
CLSR STERI-STRIP ANTIMIC 1/2X4 (GAUZE/BANDAGES/DRESSINGS) ×2 IMPLANT
COVER PERINEAL POST (MISCELLANEOUS) ×3 IMPLANT
COVER SURGICAL LIGHT HANDLE (MISCELLANEOUS) ×3 IMPLANT
DRAPE STERI IOBAN 125X83 (DRAPES) ×3 IMPLANT
DRSG MEPILEX BORDER 4X4 (GAUZE/BANDAGES/DRESSINGS) ×6 IMPLANT
DRSG MEPILEX BORDER 4X8 (GAUZE/BANDAGES/DRESSINGS) ×6 IMPLANT
DURAPREP 26ML APPLICATOR (WOUND CARE) ×3 IMPLANT
ELECT CAUTERY BLADE 6.4 (BLADE) ×3 IMPLANT
ELECT REM PT RETURN 9FT ADLT (ELECTROSURGICAL) ×3
ELECTRODE REM PT RTRN 9FT ADLT (ELECTROSURGICAL) ×1 IMPLANT
EVACUATOR 1/8 PVC DRAIN (DRAIN) IMPLANT
FACESHIELD WRAPAROUND (MASK) ×6 IMPLANT
GAUZE XEROFORM 5X9 LF (GAUZE/BANDAGES/DRESSINGS) ×3 IMPLANT
GLOVE BIOGEL PI ORTHO PRO SZ8 (GLOVE) ×2
GLOVE ORTHO TXT STRL SZ7.5 (GLOVE) ×6 IMPLANT
GLOVE PI ORTHO PRO STRL SZ8 (GLOVE) ×1 IMPLANT
GLOVE SURG ORTHO 8.0 STRL STRW (GLOVE) ×6 IMPLANT
GOWN STRL REUS W/ TWL LRG LVL3 (GOWN DISPOSABLE) IMPLANT
GOWN STRL REUS W/TWL LRG LVL3 (GOWN DISPOSABLE)
GUIDEWIRE 3.2X400 (WIRE) ×3 IMPLANT
KIT ROOM TURNOVER OR (KITS) ×3 IMPLANT
LINER BOOT UNIVERSAL DISP (MISCELLANEOUS) ×3 IMPLANT
MANIFOLD NEPTUNE II (INSTRUMENTS) ×3 IMPLANT
NAIL CANN TFNA 11X130X380 LEFT (Nail) ×3 IMPLANT
NS IRRIG 1000ML POUR BTL (IV SOLUTION) ×3 IMPLANT
PACK GENERAL/GYN (CUSTOM PROCEDURE TRAY) ×3 IMPLANT
PAD ARMBOARD 7.5X6 YLW CONV (MISCELLANEOUS) ×6 IMPLANT
REAMER ROD DEEP FLUTE 2.5X950 (INSTRUMENTS) ×3 IMPLANT
STAPLER VISISTAT 35W (STAPLE) ×3 IMPLANT
SUT VIC AB 0 CTB1 27 (SUTURE) ×3 IMPLANT
SUT VIC AB 2-0 FS1 27 (SUTURE) ×3 IMPLANT
SUT VIC AB 2-0 SH 27 (SUTURE)
SUT VIC AB 2-0 SH 27XBRD (SUTURE) IMPLANT
SUT VIC AB 3-0 SH 8-18 (SUTURE) ×3 IMPLANT
TOWEL OR 17X24 6PK STRL BLUE (TOWEL DISPOSABLE) ×3 IMPLANT
TOWEL OR 17X26 10 PK STRL BLUE (TOWEL DISPOSABLE) ×3 IMPLANT
WATER STERILE IRR 1000ML POUR (IV SOLUTION) ×3 IMPLANT

## 2014-07-06 NOTE — Anesthesia Procedure Notes (Signed)
Procedure Name: Intubation Date/Time: 07/06/2014 4:25 PM Performed by: Melina Copa, Monda Chastain R Pre-anesthesia Checklist: Patient identified, Emergency Drugs available, Suction available, Patient being monitored and Timeout performed Patient Re-evaluated:Patient Re-evaluated prior to inductionOxygen Delivery Method: Circle system utilized Preoxygenation: Pre-oxygenation with 100% oxygen Intubation Type: IV induction Ventilation: Mask ventilation without difficulty Laryngoscope Size: Mac and 4 Grade View: Grade I Tube type: Oral Tube size: 7.0 mm Number of attempts: 1 Airway Equipment and Method: Stylet Placement Confirmation: ETT inserted through vocal cords under direct vision,  breath sounds checked- equal and bilateral and positive ETCO2 Secured at: 22 cm Tube secured with: Tape Dental Injury: Teeth and Oropharynx as per pre-operative assessment

## 2014-07-06 NOTE — Anesthesia Preprocedure Evaluation (Addendum)
Anesthesia Evaluation  Patient identified by MRN, date of birth, ID band Patient awake    Reviewed: Allergy & Precautions, H&P , NPO status , Patient's Chart, lab work & pertinent test results  Airway Mallampati: III  TM Distance: >3 FB Neck ROM: Full    Dental  (+) Edentulous Upper, Dental Advisory Given   Pulmonary COPD (Daily hemoptysis/ blood tinged sputum production. ) COPD inhaler, former smoker,  breath sounds clear to auscultation        Cardiovascular negative cardio ROS  Rhythm:Regular Rate:Normal     Neuro/Psych negative neurological ROS  negative psych ROS   GI/Hepatic negative GI ROS, Neg liver ROS,   Endo/Other  negative endocrine ROS  Renal/GU negative Renal ROS     Musculoskeletal  (+) Arthritis -,   Abdominal   Peds  Hematology negative hematology ROS (+) Blood dyscrasia (Factor X deficiency), ,   Anesthesia Other Findings   Reproductive/Obstetrics                            Anesthesia Physical Anesthesia Plan  ASA: III  Anesthesia Plan: General   Post-op Pain Management:    Induction: Intravenous  Airway Management Planned: Oral ETT  Additional Equipment:   Intra-op Plan:   Post-operative Plan: Extubation in OR  Informed Consent: I have reviewed the patients History and Physical, chart, labs and discussed the procedure including the risks, benefits and alternatives for the proposed anesthesia with the patient or authorized representative who has indicated his/her understanding and acceptance.   Dental advisory given  Plan Discussed with: Anesthesiologist  Anesthesia Plan Comments:        Anesthesia Quick Evaluation

## 2014-07-06 NOTE — Op Note (Signed)
DATE OF SURGERY:  07/06/2014  TIME: 5:28 PM  PATIENT NAME:  Calvin [name]  AGE: 68 y.o.  PRE-OPERATIVE DIAGNOSIS:   left intertrochanteric hip fracture  POST-OPERATIVE DIAGNOSIS:  SAME  PROCEDURE:  INTRAMEDULLARY (IM) NAIL INTERTROCHANTRIC Left  SURGEON:  Lakesia Dahle P  ASSISTANT:  Joya Gaskins, OPA-C, present and scrubbed throughout the case, critical for assistance with exposure, retraction, instrumentation, and closure.  OPERATIVE IMPLANTS: Synthes trochanteric femoral nail with interlocking helical blade into the femoral head.  PREOPERATIVE INDICATIONS:  [name] is a 68 y.o. year old who fell and suffered a hip fracture. He was brought into the ER and then admitted and optimized and then elected for surgical intervention.    The risks benefits and alternatives were discussed with the patient including but not limited to the risks of nonoperative treatment, versus surgical intervention including infection, bleeding, nerve injury, malunion, nonunion, hardware prominence, hardware failure, need for hardware removal, blood clots, cardiopulmonary complications, morbidity, mortality, among others, and they were willing to proceed.    OPERATIVE PROCEDURE:  The patient was brought to the operating room and placed in the supine position. General anesthesia was administered, with a foley. He was placed on the fracture table.  Closed reduction was performed under C-arm guidance. The length of the femur was also measured using fluoroscopy. Time out was then performed after sterile prep and drape. He received preoperative antibiotics.  Incision was made proximal to the greater trochanter. A guidewire was placed in the appropriate position. Confirmation was made on AP and lateral views. The above-named nail was opened. I opened the proximal femur with a reamer. The canal was too tight to place the nail by hand, and I reamed up to a size 12.5, and I sent reamings from the opening of  the canal as well as from the intramedullary cavity to pathology for evaluation given the patient's history of prostate cancer.  Once the nail was completely seated, I placed a guidepin into the femoral head into the center center position. I measured the length, and then reamed the lateral cortex and up into the head. I then placed the helical blade. Slight compression was applied. Anatomic fixation achieved. Bone quality was reasonably good.  I then secured the proximal interlocking bolt, and took off a half a turn, and then removed the instruments, and took final C-arm pictures AP and lateral the entire length of the leg. Anatomic reconstruction was achieved, and the wounds were irrigated copiously and closed with Vicryl followed by Steri-Strips and sterile gauze for the skin. The patient was awakened and returned to PACU in stable and satisfactory condition. There no complications and the patient tolerated the procedure well.  He will be weightbearing as tolerated, and will be on Lovenox  for a period of three weeks after discharge.   Marchia Bond, M.D.

## 2014-07-06 NOTE — Progress Notes (Signed)
Family Medicine Teaching Service Daily Progress Note Intern Pager: [phone]  Patient name: Calvin [name] Medical record number: [id number] Date of birth: 01/06/1946 Age: 68 y.o. Gender: male  Primary Care Provider: PROVIDER NOT IN SYSTEM (Dr. Baldemar Lenis at [facility] in [place]) Consultants: ortho Code Status: FULL  Pt Overview and Major Events to Date:  07/05/14: patient admitted with intra-trochanteric femur fracture, pain well-controlled with IV morphine and IV dilaudid for breakthrough pain  Assessment and Plan:  Calvin [name] is a 68 y.o. male with a history of asthma, prostate cancer, COPD, cervical disc protrusion, presenting with acute left-sided intra-trochanteric femur fracture.   # Left sided intra-trochanteric femur fracture - s/p fall resulting from presyncopal event earlier today resulting in hip fracture. XR of hip and femur outlined under radiology.  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 Patient denies true syncope. Denies any presyncope overnight. 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  -monitoring on tele, will f/u with his PCP today to see if this is chronic problem for him -given this is cough-induced presyncope will treat cough as COPD exacerbation per below  -consider further imaging if has true syncopal event or develops new neurological changes    # COPD exacerbation - long history of smoking and recent worsening of cough with CXR revealing chronic bronchitic changes. Now with increasing white sputum.  -prednisone 50 mg daily for 5 days (10/29-) -start on doxy 100 mg BID   -albuterol nebs q6/q2 prn  -consider addition of inhaled corticosteroid at discharge   # Hemoptysis - patient reported coughing up scant amount of blood intermittently for past 2 weeks, occurring here in hospital as well. Potentially related to COPD exacerbation. No evidence of PNA on CXR. Given smoking history concern for malignant process. Hgb is in normal range.  -f/u cbc from this morning -monitor for further hemoptysis  -get CT chest after surgery today  # Leukocytosis - WBC 15.5. No signs of infection on CXR. No fevers. Not currently on steroids. Could be related to COPD exacerbation or hip fracture. -f/u cbc from this morning -UA pending   # Cervical disc protrusion and radiculopathy - Patient reported history of this and no recent worsening. Likely this is the cause of his intermittent bilateral hands numbness.  -continue gabapentin at this time  -last MRI done 2 years ago per patient; can f/u as outpatient for this  # Hx of prostate cancer - previously treated.  -continue home proscar and flomax   # RBBB and LPFB - patient reported "skip" in heart beat in the past, though no knowledge of these EKG findings.  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 INR is normal.  -will monitor CBCs closely post-op and monitor for bleeding at surgical site -scds for DVT prophylaxis  FEN/GI: NS 125 mL/hr, NPO after midnight PPx: SCDs on and active  Disposition: discharge pending surgical intervention on fractured femur as well as workup of cough/hemoptysis  Subjective:  Nauseous upon entering patient room, received Zofran and nausea abated. Pain level 2/10 in left hip. Still intermittently coughing up scant blood. Patient states this hemoptysis has been occurring for 2 weeks, but increased cough has been  going on for 4 months. No further feelings of light-headedness of like he might pass out. No episodes of hand numbness.  Objective: Temp:  [97.9 F (36.6 C)-98.5 F (36.9 C)] 97.9 F (36.6 C) (10/30 2878) Pulse Rate:  [95-118] 104 (10/30 0608) Resp:  [14-20] 18 (10/30 0608) BP: (110-144)/(53-94) 134/78 mmHg (10/30 0608) SpO2:  [93 %-100 %] 100 % (10/30 0848)  Physical Exam: General: NAD, resting in bed, coughing intermittently Cardiovascular: RRR, no MRG Respiratory: CTAB, no wheezes or crackles Abdomen: NABS, abdomen mildly distended, nontender to palpation, no masses or HSM Extremities: LLE shortened and externally rotated, warm, well perfused bilateral LE with 2+ dorsalis pedis pulses Neuro: alert and oriented, no focal deficits, grip strength 5/5    Laboratory:   Recent Labs Lab 07/05/14 1804  WBC 15.5*  HGB 13.3  HCT 39.7  PLT 266    Recent Labs Lab 07/05/14 1804 07/06/14 0610  NA 136* 139  K 4.6 4.9  CL 100 102  CO2 22 28  BUN 16 19  CREATININE 1.06 1.19  CALCIUM 9.0 8.6  PROT  --  7.0  BILITOT  --  0.5  ALKPHOS  --  63  ALT  --  9  AST  --  13  GLUCOSE 107* 137*   INR 1.11  Imaging/Diagnostic Tests: Dg Chest 1 View  07/05/2014 CLINICAL DATA: Cough. Fall with left hip pain. EXAM: CHEST - 1 VIEW COMPARISON: None visible FINDINGS: Probable cardiomegaly, although there is distortion from leftward rotation. No gross upper mediastinal contour abnormality. Diffuse interstitial coarsening which is likely bronchitic, with hyperinflation. These findings were also reported on chest x-ray from 02/28/2014, images currently not available. No pulmonary edema, effusion, or pneumothorax. No evidence of fracture. IMPRESSION: Chronic bronchitic changes. No edema or pneumonia. Electronically Signed By: Jorje Guild M.D. On: 07/05/2014 19:15   Dg Hip Complete Left  07/05/2014 CLINICAL DATA: Fall on left hip, left hip pain EXAM: LEFT HIP - COMPLETE 2+ VIEW COMPARISON:  None. FINDINGS: Intertrochanteric left hip fracture, minimally displaced. Mildly displaced lesser trochanteric fragment. Visualized bony pelvis appears intact. Brachytherapy seeds overlying the prostate. IMPRESSION: Intertrochanteric left hip fracture, as above. Electronically Signed By: Julian Hy M.D. On: 07/05/2014 19:21   Dg Femur Left  07/05/2014 CLINICAL DATA: Dizzy, fall today onto hip. EXAM: LEFT FEMUR - 2 VIEW COMPARISON: None. FINDINGS: Skeleton fracture of the distal left femur. No dislocation. IMPRESSION: No fracture or dislocation. Electronically Signed By: Suzy Bouchard M.D. On: 07/05/2014 19:13    Darlys Gales, MS4 [place] Intern pager: [phone], text pages welcome  I have seen and examined the patient with the medical student and agree with her assessment and plan. Briefly, 68 y.o. male with h/o prostate cancer, COPD, ?factor X deficiency, and smoking hx here with left intertrochanteric femur fracture, presyncope related to chronic cough, and leukocytosis.  S: Patient reports coughing up some blood and having resultant nausea, 2/10 left hip pain unless he moves in which case it is excruciating. Cough has been present 4 months.  O:  BP 134/78  Pulse 104  Temp(Src) 97.9 F (36.6 C) (Oral)  Resp 18  SpO2 100% GEN: NAD, lying in bed CV: RRR, distant PULM: Anteriorly due to hip pain with movement, lungs relatively clear but also distant, faint wheezing heard  ABD: Obese, soft, nontender, nondistended LE: Able to wiggle left toe, left hip externally rotated  A/P: 68 y.o. male with h/o prostate cancer, COPD, ?factor X deficiency, and smoking hx here with left intertrochanteric femur fracture, presyncope related to chronic cough, and leukocytosis. # Left intertrochanteric femur fracture - Ortho seeing today for possible mid-day surg. Pain being managed by IV morphine prn and oxycodone prn. Holding anticoagulation until after surgery. NPO. # Presyncope - H/o RBBB and left  posterior fascicular block on EKG, uncertain if new or old as none to compare. Per PCP Dr Baldemar Lenis [facility] - recent transition to new PCP. Previously documented RBBB. PFTs 8 years ago normal. Last echo 2005 with normal EF per discussion with PCP. Will reorder echo and consult cardiology given no previously documented LPFB. No other prescyncope workup at this time as thought most likely related to cough. # Cough - Chronic for 4 months with recent blood-tinge likely due to microtrauma but no increase in sputum production, frequency, or intensity. CXR with chronic bronchitic changes. Treating as COPD exacerbation at this time. Will order chest CT after his more pressing ortho surgery. ?bronchoscopy depending on CT results to eval for malignancy. Adding tessalon perles 100mg  TID PRN. # Leukocytosis - Uncertain etiology. No fever. Possibly due to fracture or COPD exac. No urinary sx but UA and cx drawn. Monitor. # ?Factor X deficiency - Per family, found out after daughter had issues in pregnancy and was tested for blood disorder. Has never taken medication for it. Had issue with bleeding during ENT surgery in the past. Will need to monitor closely in setting of fracture and upcoming surgery. Not on pharmacologic dvt ppx at this time. INR normal. # Prostate cancer hx - Remove hx, previous tx. On flomax and proscar.  Hilton Sinclair, MD PGY-3, [facility] 07/06/2014 9:16 AM
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
## 2014-07-06 NOTE — Transfer of Care (Signed)
Immediate Anesthesia Transfer of Care Note  Patient: Calvin [name]  Procedure(s) Performed: Procedure(s): INTRAMEDULLARY (IM) NAIL INTERTROCHANTRIC Left (Left)  Patient Location: PACU  Anesthesia Type:General  Level of Consciousness: awake, alert  and oriented  Airway & Oxygen Therapy: Patient Spontanous Breathing and Patient connected to nasal cannula oxygen  Post-op Assessment: Report given to PACU RN, Post -op Vital signs reviewed and stable and Patient moving all extremities  Post vital signs: Reviewed and stable  Complications: No apparent anesthesia complications

## 2014-07-06 NOTE — Progress Notes (Signed)
Orthopedic Tech Progress Note Patient Details:  Calvin [name] 01-19-1946 [id number]  Ortho Devices Ortho Device/Splint Location: trapeze bar patient helper Ortho Device/Splint Interventions: Application   Hildred Priest 07/06/2014, 8:26 PM

## 2014-07-06 NOTE — H&P (Signed)
I have seen and examined this patient. I have discussed with Dr Caryl Bis.  I agree with their findings and plans as documented in their admission note.  Patient accompanied by his wife.  Patient, wife, and EMR (including Care Everywhere function) source of clinical information.   Acute Issues 1. Left Intertrochanteric Fracture, acute, traumatic secondary to #2 2. Fall secondary to #3 3. Near-syncope - Working explanation is Cough Syncope Syndrome 4. Chronic Cough, hemoptic  - Onset in approximately May 2015.  Two office visits with PCP at [facility].  Diagnosis with Acute on Chronic Sinusitis in May - Started on nasal steroid.  June visit patient diagnosed with AE-COPD treated with pulse dose steroid and Advair.  - Cough always preceding episodes of near-syncope, including this last event resulting in retropulsive fall and hip fracture. - Hemoptysis started about three weeks ago, may be increasing in amount.  Patient with visible red blood mixed with clear sputum in bedside basin on exam today. Sputum greater than few tablespoons with about equal amounts sputum and red blood.  - Patient has history of Chronic Nasal Sinusitis with Nasal Polyps requiring sinus surgery three times in past.  Last sinus surgery 3 years ago.  This last sinus surgery complicated by surgical hemorrhage that was difficult to control.  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  Pt reports susceptibility to easy bruising but not to spontaneous bleeding or significantly prolonged bleeding.  See history of sinus surgery hemorrhage complication.  - pCXR on admission show an indistinct opacity in right lower lung field.  Pt's medical record shows a chest xray from [facility] in 1998 reporting an opacity in right lower lung that was thought to be a [place] Hernia of the diaphragm.  - Patient with anaphylactic reaction to  Aspirin along with nasal polyps.  - Pt with ~ 50 year-pack history of smoking.  - Pt denies weight loss, fever, night sweats, malaise.  - Patient with distant history of capsule contained Prostate cancer treated with brachytherapy.  Pt continues to be followed by a urologist.  - Differential includes Acute on chronic bronchitis, bronchiectasis, bronchogenic or pulmonary parenchymal malignancy, Coagulopathy, pulmonary vascular disease process (e.g. Heart failure, valvular disorder, Pulmonary embolism).   2. Possible Acute Exacerbation of COPD  3. At-risk of Post-operative Delirium - Risk factors: Male, age, Comorbidities, orthopedic surgery  Recommendations - Monitor for delirium daily  - Adequate analgesia: scheduled APAP with oxycodone 5 mg every 4 hours as needed moderate pain and Morphine IV 4 mg every 2 hours as needed for severe pain.  Senokot started.  - Sequential compression stockings instead of pharmacologic anticoagulation given active hemoptysis and history Factor 10 deficiency and history of sinus surgery hemorrhage.  - Avoid aspirin and aspirin products as patient has anaphylaxis response to exposure.  - If foley placed for surgery, trial with it out POD #1.  - Check Vitamin 25(OH) vit D level and supplement as indicated.  Ensure adequate calcium intake at discharge. - Start Bisphosphonate by discharge for osteoporosis treatment  - Arrange CT chest with High resolution cuts during hospitalization or as outpatient for work-up of hemoptysis. Also, consider obtaining a limited Sinus CT to look for sinusitis origin of chronic cough.    Agree with treatment for possible AE-COPD with pulse dose prednisone and narrow spectrum antibiotic. Restarted Advair.   Echocardiogram ordered to look at ejection fraction, heart fvalves and PAP estimate  in patient with hemoptysis.

## 2014-07-06 NOTE — Progress Notes (Addendum)
disussed with patient who would like for [place] to proceed with surgery despite the high risk.  Will plan surgery later today.  The patient has been re-examined, and the chart reviewed, and there have been no interval changes to the documented history and physical.    The risks, benefits, and alternatives have been discussed at length, and the patient is willing to proceed.    Johnny Bridge, MD

## 2014-07-06 NOTE — Anesthesia Postprocedure Evaluation (Signed)
  Anesthesia Post-op Note  Patient: Calvin [name]  Procedure(s) Performed: Procedure(s): INTRAMEDULLARY (IM) NAIL INTERTROCHANTRIC Left (Left)  Patient Location: PACU  Anesthesia Type:General  Level of Consciousness: awake, alert , oriented and patient cooperative  Airway and Oxygen Therapy: Patient Spontanous Breathing and Patient connected to nasal cannula oxygen  Post-op Pain: none  Post-op Assessment: Post-op Vital signs reviewed, Patient's Cardiovascular Status Stable, Respiratory Function Stable, Patent Airway, No signs of Nausea or vomiting and Pain level controlled  Post-op Vital Signs: Reviewed and stable  Last Vitals:  Filed Vitals:   07/06/14 1900  BP:   Pulse: 99  Temp:   Resp: 20    Complications: No apparent anesthesia complications

## 2014-07-06 NOTE — Progress Notes (Signed)
Utilization review completed.  

## 2014-07-07 ENCOUNTER — Inpatient Hospital Stay (HOSPITAL_COMMUNITY): Payer: Medicare Other

## 2014-07-07 ENCOUNTER — Encounter (HOSPITAL_COMMUNITY): Payer: Self-pay

## 2014-07-07 LAB — BASIC METABOLIC PANEL
Anion gap: 11 (ref 5–15)
BUN: 21 mg/dL (ref 6–23)
CO2: 25 meq/L (ref 19–32)
CREATININE: 1 mg/dL (ref 0.50–1.35)
Calcium: 8.7 mg/dL (ref 8.4–10.5)
Chloride: 100 mEq/L (ref 96–112)
GFR calc Af Amer: 86 mL/min — ABNORMAL LOW (ref >90)
GFR, EST NON AFRICAN AMERICAN: 74 mL/min — AB (ref >90)
Glucose, Bld: 144 mg/dL — ABNORMAL HIGH (ref 70–99)
Potassium: 4.8 mEq/L (ref 3.7–5.3)
Sodium: 136 mEq/L — ABNORMAL LOW (ref 137–147)

## 2014-07-07 LAB — TSH: TSH: 1.78 u[IU]/mL (ref 0.350–4.500)

## 2014-07-07 LAB — CBC
HCT: 34.2 % — ABNORMAL LOW (ref 39.0–52.0)
Hemoglobin: 11.1 g/dL — ABNORMAL LOW (ref 13.0–17.0)
MCH: 28.5 pg (ref 26.0–34.0)
MCHC: 32.5 g/dL (ref 30.0–36.0)
MCV: 87.7 fL (ref 78.0–100.0)
Platelets: 279 10*3/uL (ref 150–400)
RBC: 3.9 MIL/uL — AB (ref 4.22–5.81)
RDW: 13.9 % (ref 11.5–15.5)
WBC: 14.7 10*3/uL — ABNORMAL HIGH (ref 4.0–10.5)

## 2014-07-07 MED ORDER — IOHEXOL 300 MG/ML  SOLN
100.0000 mL | Freq: Once | INTRAMUSCULAR | Status: AC | PRN
  Administered 2014-07-07: 75 mL via INTRAVENOUS

## 2014-07-07 NOTE — Evaluation (Signed)
Occupational Therapy Evaluation Patient Details Name: Calvin [name] MRN: [id number] DOB: 1945-12-05 Today's Date: 07/07/2014    History of Present Illness 68 y.o. male s/p left intertrochanteric IM nailing after sustaining a fracture from a near-syncopal episode.   Clinical Impression   Pt was performing ADL independently and walking with a cane prior to admission.  Pt now requires min assist for ADL and min guard for ambulation with a RW. Instructed pt in home safety including a walker bag for transporting items and keeping phone nearby, showering when wife is available to supervise, removal of throw rugs, good lighting and avoiding reaching to floor.  Pt will purchase a shower seat on his own, 3 in 1 not likely to fit in his shower.  Pt is interested in further practice with AE as LB dressing was an issue prior admission.  Pt does not have 24 hour care and is resistant to SNF.  Recommending SNF as pt is a fall risk due to syncope.   Follow Up Recommendations  SNF;Supervision/Assistance - 24 hour (HHOT if pt refuses)   Equipment Recommendations  3 in 1 bedside comode    Recommendations for Other Services       Precautions / Restrictions Precautions Precautions: Fall Restrictions Weight Bearing Restrictions: Yes LLE Weight Bearing: Weight bearing as tolerated      Mobility Bed Mobility Pt up in chair.     Transfers Overall transfer level: Needs assistance Equipment used: Rolling walker (2 wheeled) Transfers: Sit to/from Stand Sit to Stand: Min guard         General transfer comment: Min guard for safety. VC for hand placement.    Balance Overall balance assessment: Needs assistance;History of Falls Sitting-balance support: No upper extremity supported;Feet supported Sitting balance-Leahy Scale: Good     Standing balance support: Bilateral upper extremity supported Standing balance-Leahy Scale: Poor                              ADL Overall ADL's :  Needs assistance/impaired Eating/Feeding: Independent;Sitting   Grooming: Wash/dry hands;Min guard   Upper Body Bathing: Sitting;Supervision/ safety   Lower Body Bathing: Minimal assistance;Sit to/from stand   Upper Body Dressing : Supervision/safety;Sitting   Lower Body Dressing: Minimal assistance;Sit to/from stand   Toilet Transfer: Min guard;Ambulation;BSC   Toileting- Water quality scientist and Hygiene: Sit to/from stand;Minimal assistance       Functional mobility during ADLs: Min guard;Rolling walker General ADL Comments: Pt reports difficulty dressing L LE prior to admission, very interested in practicing with AE.  Has a reacher and bath brush at home.     Vision                     Perception     Praxis      Pertinent Vitals/Pain Pain Assessment: 0-10 Pain Score: 2  Pain Location: L hip Pain Descriptors / Indicators: Sore Pain Intervention(s): Monitored during session;Premedicated before session;Repositioned     Hand Dominance Right   Extremity/Trunk Assessment Upper Extremity Assessment Upper Extremity Assessment: Overall WFL for tasks assessed   Lower Extremity Assessment Lower Extremity Assessment: Defer to PT evaluation LLE Deficits / Details: Decreased strength and ROM as expected post op       Communication Communication Communication: No difficulties   Cognition Arousal/Alertness: Awake/alert Behavior During Therapy: Anxious Overall Cognitive Status: Within Functional Limits for tasks assessed  General Comments       Exercises       Shoulder Instructions      Home Living Family/patient expects to be discharged to:: Private residence Living Arrangements: Spouse/significant other Available Help at Discharge: Family;Friend(s);Available PRN/intermittently (wife works days) Type of Home: House Home Access: Stairs to enter Technical brewer of Steps: 1 Entrance Stairs-Rails: Right;Left Home  Layout: One level     Bathroom Shower/Tub: Occupational psychologist: [place]: Wheelchair - power;Cane - single point;Adaptive equipment Adaptive Equipment: Long-handled sponge;Reacher        Prior Functioning/Environment Level of Independence: Independent with assistive device(s)        Comments: Used cane for ambulation POA    OT Diagnosis: Generalized weakness;Acute pain   OT Problem List: Decreased strength;Impaired balance (sitting and/or standing);Decreased activity tolerance;Decreased safety awareness;Decreased knowledge of use of DME or AE;Pain   OT Treatment/Interventions: Self-care/ADL training;DME and/or AE instruction;Patient/family education;Therapeutic activities    OT Goals(Current goals can be found in the care plan section) Acute Rehab OT Goals Patient Stated Goal: Go home OT Goal Formulation: With patient Time For Goal Achievement: 07/14/14 Potential to Achieve Goals: Good  OT Frequency: Min 2X/week   Barriers to D/C: Decreased caregiver support          Co-evaluation              End of Session Equipment Utilized During Treatment: Gait belt;Rolling walker;Oxygen  Activity Tolerance: Patient tolerated treatment well Patient left: in chair;with call bell/phone within reach;with family/visitor present   Time: 1540-1610 OT Time Calculation (min): 30 min Charges:  OT General Charges $OT Visit: 1 Procedure OT Evaluation $Initial OT Evaluation Tier I: 1 Procedure OT Treatments $Self Care/Home Management : 8-22 mins G-Codes:    Malka So 07/07/2014, 4:33 PM [phone]

## 2014-07-07 NOTE — Progress Notes (Signed)
Subjective: 1 Day Post-Op Procedure(s) (LRB): INTRAMEDULLARY (IM) NAIL INTERTROCHANTRIC Left (Left) Patient reports pain as 2 on 0-10 scale.  No nausea/vomiting, lightheadedness/dizziness, chest pain or shortness of breath.  No flatus and no bm as of yet.  Tolerating diet.  Objective: Vital signs in last 24 hours: Temp:  [97.3 F (36.3 C)-99 F (37.2 C)] 99 F (37.2 C) (10/31 0523) Pulse Rate:  [78-109] 78 (10/31 0523) Resp:  [11-22] 18 (10/31 0523) BP: (123-165)/(66-95) 132/66 mmHg (10/31 0523) SpO2:  [91 %-98 %] 95 % (10/31 0950)  Intake/Output from previous day: 10/30 0701 - 10/31 0700 In: 2040 [P.O.:540; I.V.:1425] Out: 850 [Urine:600; Blood:250] Intake/Output this shift: Total I/O In: 240 [P.O.:240] Out: -    Recent Labs  07/05/14 1804 07/06/14 1254 07/07/14 0720  HGB 13.3 12.8* 11.1*    Recent Labs  07/06/14 1254 07/07/14 0720  WBC 11.8* 14.7*  RBC 4.46 3.90*  HCT 39.2 34.2*  PLT 271 279    Recent Labs  07/06/14 0610 07/07/14 0720  NA 139 136*  K 4.9 4.8  CL 102 100  CO2 28 25  BUN 19 21  CREATININE 1.19 1.00  GLUCOSE 137* 144*  CALCIUM 8.6 8.7    Recent Labs  07/05/14 1804  INR 1.11    Neurologically intact Neurovascular intact Sensation intact distally Intact pulses distally Dorsiflexion/Plantar flexion intact Compartment soft Negative homans bilaterally No drainage noted through dressing  Assessment/Plan: 1 Day Post-Op Procedure(s) (LRB): INTRAMEDULLARY (IM) NAIL INTERTROCHANTRIC Left (Left) Advance diet Up with therapy WBAT LLE Dry dressing change prn ABLA-asymptomatic but will continue to follow Continue plan per medicine  ANTON, M. LINDSEY 07/07/2014, 10:02 AM

## 2014-07-07 NOTE — Progress Notes (Signed)
FMTS Attending Note Patient seen and examined by me, discussed with resident team and I agree with Dr Nancy Nordmann note for today.  Patient report adequate pain control.  Says his cough/hemoptysis is improved.  L hip fracture management per Ortho.  For the patient's cough/hemoptysis, plan for CT chest to be done today.   Will watch Hgb in light of surgery 10/30, as well as with IV contrast to be administered for CT chest. Dalbert Mayotte, MD

## 2014-07-07 NOTE — Evaluation (Signed)
Physical Therapy Evaluation Patient Details Name: Calvin [name] MRN: [id number] DOB: 12/17/1945 Today's Date: 07/07/2014   History of Present Illness  68 y.o. male s/p left intertrochanteric IM nailing after sustaining a fracture from a near-syncopal episode.  Clinical Impression  Patient is seen following the above surgery, presenting with functional limitations due to the deficits listed below (see PT Problem List). Ambulates up to 75 feet today with min guard assist for safety while using a rolling walker. Refuses SNF as an option for discharge. Recommend SNF due to history of near-syncope resulting in a fall with fracture and pt to be home alone approximately 10 hours per day. He will need HHPT if d/c to home. Patient will benefit from skilled PT to increase their independence and safety with mobility to allow discharge to the venue listed below.      Follow Up Recommendations SNF;Other (comment) (Pt refusing SNF - will need HHPT)    Equipment Recommendations  Rolling walker with 5" wheels;3in1 (PT)    Recommendations for Other Services OT consult     Precautions / Restrictions Precautions Precautions: Fall Restrictions Weight Bearing Restrictions: Yes LLE Weight Bearing: Weight bearing as tolerated      Mobility  Bed Mobility Overal bed mobility: Needs Assistance Bed Mobility: Supine to Sit     Supine to sit: Min assist     General bed mobility comments: Min assist for LLE support out of bed. VC for technique.  Transfers Overall transfer level: Needs assistance Equipment used: Rolling walker (2 wheeled) Transfers: Sit to/from Stand Sit to Stand: Min guard         General transfer comment: Min guard for safety. VC for hand placement. Performed from lowest bed setting  Ambulation/Gait Ambulation/Gait assistance: Min guard Ambulation Distance (Feet): 75 Feet Assistive device: Rolling walker (2 wheeled) Gait Pattern/deviations: Step-to pattern;Decreased step  length - right;Decreased stance time - left;Antalgic;Trunk flexed   Gait velocity interpretation: Below normal speed for age/gender General Gait Details: Educated on safe DME use with a rolling walker. VC for sequencing of gait. Pt progressed weight-bearing ability through LLE throughout bout. No loss of balance noted. Intermittent cues for walker placement.  Stairs            Wheelchair Mobility    Modified Rankin (Stroke Patients Only)       Balance Overall balance assessment: Needs assistance;History of Falls Sitting-balance support: No upper extremity supported;Feet supported Sitting balance-Leahy Scale: Good     Standing balance support: Bilateral upper extremity supported Standing balance-Leahy Scale: Poor                               Pertinent Vitals/Pain Pain Assessment: 0-10 Pain Score: 4  Pain Location: Lt hip Pain Intervention(s): Monitored during session;Repositioned;Patient requesting pain meds-RN notified    Home Living Family/patient expects to be discharged to:: Private residence Living Arrangements: Spouse/significant other Available Help at Discharge: Family (wife works 9am - 7 pm) Type of Home: House Home Access: Stairs to enter Entrance Stairs-Rails: Psychiatric nurse of Steps: 1 Home Layout: One level Home Equipment: Wheelchair - power;Cane - single point      Prior Function Level of Independence: Independent with assistive device(s)         Comments: Used cane for ambulation POA     Hand Dominance   Dominant Hand: Right    Extremity/Trunk Assessment   Upper Extremity Assessment: Defer to OT evaluation  Lower Extremity Assessment: LLE deficits/detail   LLE Deficits / Details: Decreased strength and ROM as expected post op     Communication   Communication: No difficulties  Cognition Arousal/Alertness: Awake/alert Behavior During Therapy: WFL for tasks assessed/performed Overall  Cognitive Status: Within Functional Limits for tasks assessed                      General Comments General comments (skin integrity, edema, etc.): Heart rate 115 at rest, elevated to low 130s during ambulation.    Exercises        Assessment/Plan    PT Assessment Patient needs continued PT services  PT Diagnosis Difficulty walking;Abnormality of gait;Acute pain   PT Problem List Decreased strength;Decreased range of motion;Decreased activity tolerance;Decreased balance;Decreased mobility;Decreased knowledge of use of DME;Decreased knowledge of precautions;Pain  PT Treatment Interventions DME instruction;Stair training;Gait training;Functional mobility training;Therapeutic activities;Therapeutic exercise;Balance training;Neuromuscular re-education;Patient/family education;Modalities   PT Goals (Current goals can be found in the Care Plan section) Acute Rehab PT Goals Patient Stated Goal: Go home PT Goal Formulation: With patient Time For Goal Achievement: 07/14/14 Potential to Achieve Goals: Good    Frequency Min 5X/week   Barriers to discharge Decreased caregiver support Wife works approximately 10 hours per day.    Co-evaluation               End of Session Equipment Utilized During Treatment: Gait belt Activity Tolerance: Patient tolerated treatment well Patient left: in chair;with call bell/phone within reach;with family/visitor present Nurse Communication: Mobility status         Time: 1420-1451 PT Time Calculation (min): 31 min   Charges:   PT Evaluation $Initial PT Evaluation Tier I: 1 Procedure PT Treatments $Therapeutic Activity: 8-22 mins   PT G Codes:         Elayne Snare, [place]  Ellouise Newer 07/07/2014, 3:30 PM

## 2014-07-07 NOTE — Progress Notes (Signed)
Family Medicine Teaching Service Daily Progress Note Intern Pager: [phone]  Patient name: Calvin [name] Medical record number: [id number] Date of birth: 1946-02-29 Age: 68 y.o. Gender: male  Primary Care Provider: PROVIDER NOT IN SYSTEM (Calvin. Baldemar [name] at [facility] in [place]) Consultants: ortho Code Status: FULL  Pt Overview and Major Events to Date:  07/05/14: patient admitted with intra-trochanteric femur fracture, pain well-controlled with IV morphine and IV dilaudid for breakthrough pain  Assessment and Plan: Calvin [name] is a 68 y.o. male with a history of asthma, prostate cancer, COPD, cervical disc protrusion, presenting with acute left-sided intra-trochanteric femur fracture.   # Left sided intra-trochanteric femur fracture - s/p fall resulting from presyncopal event earlier today resulting in hip fracture. S/p L IM nail 10/30. 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 Patient denies true syncope. 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 - Per PCP Calvin [name] [facility] -  Previously documented RBBB. PFTs 8 years ago normal. Last echo 2005 with normal EF.  - Echo ordered - Cardiology signed off - monitoring on tele - given this is cough-induced presyncope will treat cough as COPD exacerbation per below   # COPD exacerbation - long history of smoking and recent worsening of cough with CXR revealing chronic bronchitic changes. Now with increasing white sputum.  - Continue prednisone 50 mg daily for 5 days (10/29-) - Continue doxy 100 mg BID  - albuterol nebs q6/q2 prn  -consider addition of inhaled  corticosteroid at discharge   # Hemoptysis - patient reported coughing up scant amount of blood intermittently for past 2 weeks, occurring here in hospital as well. Potentially related to COPD exacerbation. No evidence of PNA on CXR. Given smoking history concern for malignant process. Hemoglobin 12.4>11.1, likely 2/2 blood loss during surgery.  -monitor for further hemoptysis  And monitor hemoglobin -CT chest with contrast, ?bronchoscopy depending on CT results to eval for malignancy - Continue tessalon pearls 100mg  TID prn  # Leukocytosis - WBC 11.8>14.7. No signs of infection on CXR. No fevers. Not currently on steroids. Could be related to COPD exacerbation or hip fracture.  WBC increase likely related to prednisone. -UA/UCx pending   # Cervical disc protrusion and radiculopathy - Patient reported history of this and no recent worsening. Likely this is the cause of his intermittent bilateral hands numbness.  -continue gabapentin at this time  -last MRI done 2 years ago per patient; can f/u as outpatient for this  # Hx of prostate cancer - previously treated.  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 INR is normal.  -will monitor CBCs closely and monitor for bleeding at surgical site -scds for DVT prophylaxis, though surgery started Lovenox (will discuss this today)  FEN/GI: 1/2NS 75 mL/hr, regular diet PPx: SCDs on and active, ?Lovenox  Disposition: discharge pending improvement in pain and w/u of hemoptysis  Subjective:  Patient reports that he feels much better today.  Hip pain is well controlled with current regimen. No more hemoptysis and cough is improving.  Patient believes that this could have  been related to using nebulizer often.  Objective: Temp:  [97.3 F (36.3 C)-99 F (37.2 C)] 99 F (37.2 C) (10/31 0523) Pulse Rate:  [78-109] 78 (10/31 0523) Resp:  [11-22] 18 (10/31  0523) BP: (123-165)/(66-95) 132/66 mmHg (10/31 0523) SpO2:  [91 %-98 %] 95 % (10/31 0950)  Physical Exam: General: NAD, resting in bed Cardiovascular: RRR, no MRG Respiratory: CTAB, no wheezes or crackles Abdomen: NABS, soft NTND, no masses or HSM Extremities: Bandage in place c/d/i over L hip. WWP, no edema, 2+ dorsalis pedis pulses Neuro: alert and oriented, no focal deficits  Laboratory:   Recent Labs Lab 07/05/14 1804 07/06/14 1254 07/07/14 0720  WBC 15.5* 11.8* 14.7*  HGB 13.3 12.8* 11.1*  HCT 39.7 39.2 34.2*  PLT 266 271 279    Recent Labs Lab 07/05/14 1804 07/06/14 0610 07/07/14 0720  NA 136* 139 136*  K 4.6 4.9 4.8  CL 100 102 100  CO2 22 28 25   BUN 16 19 21   CREATININE 1.06 1.19 1.00  CALCIUM 9.0 8.6 8.7  PROT  --  7.0  --   BILITOT  --  0.5  --   ALKPHOS  --  63  --   ALT  --  9  --   AST  --  13  --   GLUCOSE 107* 137* 144*   INR 1.11  Imaging/Diagnostic Tests: Dg Chest 1 View  07/05/2014 CLINICAL DATA: Cough. Fall with left hip pain. EXAM: CHEST - 1 VIEW COMPARISON: None visible FINDINGS: Probable cardiomegaly, although there is distortion from leftward rotation. No gross upper mediastinal contour abnormality. Diffuse interstitial coarsening which is likely bronchitic, with hyperinflation. These findings were also reported on chest x-ray from 02/28/2014, images currently not available. No pulmonary edema, effusion, or pneumothorax. No evidence of fracture. IMPRESSION: Chronic bronchitic changes. No edema or pneumonia. Electronically Signed By: Jorje Guild M.D. On: 07/05/2014 19:15   Dg Hip Complete Left  07/05/2014 CLINICAL DATA: Fall on left hip, left hip pain EXAM: LEFT HIP - COMPLETE 2+ VIEW COMPARISON: None. FINDINGS: Intertrochanteric left hip fracture, minimally displaced. Mildly displaced lesser trochanteric fragment. Visualized bony pelvis appears intact. Brachytherapy seeds overlying the prostate. IMPRESSION: Intertrochanteric left hip  fracture, as above. Electronically Signed By: Julian Hy M.D. On: 07/05/2014 19:21   Dg Femur Left  07/05/2014 CLINICAL DATA: Dizzy, fall today onto hip. EXAM: LEFT FEMUR - 2 VIEW COMPARISON: None. FINDINGS: Skeleton fracture of the distal left femur. No dislocation. IMPRESSION: No fracture or dislocation. Electronically Signed By: Suzy Bouchard M.D. On: 07/05/2014 19:13    Lavon Paganini, MD, MPH PGY-1,  [place] [facility] 07/07/2014 10:01 AM  FPTS Intern pager: [phone], text pages welcome

## 2014-07-07 NOTE — Care Management Note (Signed)
CARE MANAGEMENT NOTE 07/07/2014  Patient:  Calvin [name]   Account Number:  [id number]  Date Initiated:  07/07/2014  Documentation initiated by:    Subjective/Objective Assessment:   Pt admitted on 07/06/14 with [name] L hip fx after falling. Pt had L Hip IM Nailing.     Action/Plan:   Pt will require short term rehab at SNF.   Anticipated DC Date:  07/09/2014   Anticipated DC Plan:  SKILLED NURSING FACILITY  In-house referral  Clinical Social Worker      DC Planning Services  CM consult      [facility] Choice  NA   Choice offered to / List presented to:     DME arranged  NA        [place] arranged  NA      Status of service:  In process, will continue to follow Medicare Important Message given?   (If response is "NO", the following Medicare IM given date fields will be blank) Date Medicare IM given:   Medicare IM given by:   Date Additional Medicare IM given:   Additional Medicare IM given by:    Discharge Disposition:    Per UR Regulation:    If discussed at Long Length of Stay Meetings, dates discussed:    Comments:

## 2014-07-08 DIAGNOSIS — I369 Nonrheumatic tricuspid valve disorder, unspecified: Secondary | ICD-10-CM

## 2014-07-08 LAB — BASIC METABOLIC PANEL
ANION GAP: 9 (ref 5–15)
BUN: 23 mg/dL (ref 6–23)
CO2: 25 mEq/L (ref 19–32)
CREATININE: 1.01 mg/dL (ref 0.50–1.35)
Calcium: 8.3 mg/dL — ABNORMAL LOW (ref 8.4–10.5)
Chloride: 103 mEq/L (ref 96–112)
GFR, EST AFRICAN AMERICAN: 85 mL/min — AB (ref >90)
GFR, EST NON AFRICAN AMERICAN: 73 mL/min — AB (ref >90)
Glucose, Bld: 114 mg/dL — ABNORMAL HIGH (ref 70–99)
Potassium: 4.1 mEq/L (ref 3.7–5.3)
Sodium: 137 mEq/L (ref 137–147)

## 2014-07-08 LAB — CBC
HCT: 32.8 % — ABNORMAL LOW (ref 39.0–52.0)
HEMATOCRIT: 30.8 % — AB (ref 39.0–52.0)
Hemoglobin: 10.3 g/dL — ABNORMAL LOW (ref 13.0–17.0)
Hemoglobin: 10.8 g/dL — ABNORMAL LOW (ref 13.0–17.0)
MCH: 28.7 pg (ref 26.0–34.0)
MCH: 28.1 pg (ref 26.0–34.0)
MCHC: 33.4 g/dL (ref 30.0–36.0)
MCHC: 32.9 g/dL (ref 30.0–36.0)
MCV: 85.8 fL (ref 78.0–100.0)
MCV: 85.4 fL (ref 78.0–100.0)
PLATELETS: 238 10*3/uL (ref 150–400)
Platelets: 282 10*3/uL (ref 150–400)
RBC: 3.59 MIL/uL — ABNORMAL LOW (ref 4.22–5.81)
RBC: 3.84 MIL/uL — AB (ref 4.22–5.81)
RDW: 14 % (ref 11.5–15.5)
RDW: 14.1 % (ref 11.5–15.5)
WBC: 12.1 10*3/uL — ABNORMAL HIGH (ref 4.0–10.5)
WBC: 13.7 10*3/uL — AB (ref 4.0–10.5)

## 2014-07-08 MED ORDER — POLYETHYLENE GLYCOL 3350 17 G PO PACK
17.0000 g | PACK | Freq: Every day | ORAL | Status: DC
  Filled 2014-07-08 (×3): qty 1

## 2014-07-08 MED ORDER — PERFLUTREN LIPID MICROSPHERE
1.0000 mL | INTRAVENOUS | Status: AC | PRN
  Administered 2014-07-08: 2 mL via INTRAVENOUS
  Filled 2014-07-08: qty 10

## 2014-07-08 MED ORDER — GI COCKTAIL ~~LOC~~
30.0000 mL | Freq: Once | ORAL | Status: AC
  Administered 2014-07-08: 30 mL via ORAL
  Filled 2014-07-08: qty 30

## 2014-07-08 MED ORDER — PANTOPRAZOLE SODIUM 40 MG PO TBEC
40.0000 mg | DELAYED_RELEASE_TABLET | Freq: Every day | ORAL | Status: DC
  Administered 2014-07-08 – 2014-07-10 (×3): 40 mg via ORAL
  Filled 2014-07-08 (×3): qty 1

## 2014-07-08 NOTE — Progress Notes (Signed)
Patient states epigastric pain easing - more intermittent, less discomfort.

## 2014-07-08 NOTE — Progress Notes (Signed)
FMTS Attending NOte Patient seen and examined by me, reviewed results of chest CT with him this morning. Discussed enlarged carinal lymph node as well as hiatal hernia and distal esophageal thickening with recommendation for EGD after discharge. He is having some sxs of hiatal hernia with GERD, bothering him this AM>  Less cough, no further hemoptysis.   L femur fracture POD#2, plan per Ortho. Syncope thought cough-induced, ECHO just done.  HIatal hernia with reflux sxs: Protonix 40mg  po daily (ordered). Dalbert Mayotte, MD

## 2014-07-08 NOTE — Plan of Care (Signed)
Problem: Consults Goal: Total Joint Replacement Patient Education See Patient Education Module for education specifics. Outcome: Completed/Met Date Met:  07/08/14  Problem: Phase II Progression Outcomes Goal: Ambulates Outcome: Completed/Met Date Met:  07/08/14 Goal: Tolerating diet Outcome: Progressing Goal: Discharge plan established Outcome: Progressing

## 2014-07-08 NOTE — Progress Notes (Signed)
  Echocardiogram 2D Echocardiogram has been performed.  Darlina Sicilian M 07/08/2014, 9:41 AM

## 2014-07-08 NOTE — Plan of Care (Signed)
Problem: Phase II Progression Outcomes Goal: Tolerating diet Outcome: Completed/Met Date Met:  07/08/14 Goal: Discharge plan established Outcome: Completed/Met Date Met:  07/08/14  Problem: Phase III Progression Outcomes Goal: Pain controlled on oral analgesia Outcome: Completed/Met Date Met:  07/08/14 Goal: Ambulates Outcome: Progressing

## 2014-07-08 NOTE — Progress Notes (Signed)
Physical Therapy Treatment Patient Details Name: Calvin [name] MRN: [id number] DOB: December 10, 1945 Today's Date: 07/08/2014    History of Present Illness 68 y.o. male s/p left intertrochanteric IM nailing after sustaining a fracture from a near-syncopal episode.    PT Comments    Patient making gains with mobility and gait.  Wife works during day.  Recommended SNF for continued therapy - patient refusing SNF.  Patient will need 24 hour supervision.  Wife reports her sister can be with patient 12:00 until wife returns home from work.  She is looking into hiring someone from 8:00-12:00 am.  Discussed with wife and patient importance of 24 hour assist for safety.  Recommend HHPT at discharge.   Follow Up Recommendations  Home health PT;Supervision/Assistance - 24 hour (Patient refusing SNF.)     Equipment Recommendations  Rolling walker with 5" wheels;3in1 (PT)    Recommendations for Other Services       Precautions / Restrictions Precautions Precautions: Fall Restrictions Weight Bearing Restrictions: Yes LLE Weight Bearing: Weight bearing as tolerated    Mobility  Bed Mobility                  Transfers Overall transfer level: Needs assistance Equipment used: Rolling walker (2 wheeled) Transfers: Sit to/from Stand Sit to Stand: Min guard         General transfer comment: Min guard for safety. VC for hand placement.  Ambulation/Gait Ambulation/Gait assistance: Min guard Ambulation Distance (Feet): 110 Feet Assistive device: Rolling walker (2 wheeled) Gait Pattern/deviations: Step-through pattern;Decreased stance time - left;Decreased step length - right;Decreased weight shift to left;Antalgic Gait velocity: Decrease Gait velocity interpretation: Below normal speed for age/gender General Gait Details: Patient with short shuffling steps.  Demonstrates safe use of RW.  Encouraged patient to slow pace (taking short quick steps) to minimize dyspnea.  Dyspnea 2/4 with  gait.   Stairs            Wheelchair Mobility    Modified Rankin (Stroke Patients Only)       Balance                                    Cognition Arousal/Alertness: Awake/alert Behavior During Therapy: Anxious Overall Cognitive Status: Within Functional Limits for tasks assessed                      Exercises General Exercises - Lower Extremity Ankle Circles/Pumps: AROM;Both;10 reps;Seated Long Arc Quad: AROM;Left;10 reps;Seated Hip Flexion/Marching: AROM;Left;5 reps;Seated    General Comments        Pertinent Vitals/Pain Pain Assessment: 0-10 Pain Score: 2  Pain Location: Lt hip/leg Pain Descriptors / Indicators: Sore (Stiff) Pain Intervention(s): Monitored during session    Home Living                      Prior Function            PT Goals (current goals can now be found in the care plan section) Progress towards PT goals: Progressing toward goals    Frequency  Min 6X/week    PT Plan Discharge plan needs to be updated;Frequency needs to be updated    Co-evaluation             End of Session Equipment Utilized During Treatment: Gait belt Activity Tolerance: Patient limited by fatigue Patient left: in chair;with call bell/phone within reach;with family/visitor present  Time: 5183-3582 PT Time Calculation (min): 23 min  Charges:  $Gait Training: 8-22 mins $Therapeutic Exercise: 8-22 mins                    G Codes:      Despina Pole 2014-07-26, 2:28 PM Carita Pian. Sanjuana Kava, [place] Pager [phone]

## 2014-07-08 NOTE — Progress Notes (Signed)
Family Medicine Teaching Service Daily Progress Note Intern Pager: [phone]  Patient name: Calvin [name] Medical record number: [id number] Date of birth: Feb 01, 1946 Age: 68 y.o. Gender: male  Primary Care Provider: PROVIDER NOT IN SYSTEM (Dr. Baldemar Lenis at [facility] in [place]) Consultants: ortho Code Status: FULL  Pt Overview and Major Events to Date:  07/05/14: patient admitted with intra-trochanteric femur fracture, pain well-controlled with IV morphine and IV dilaudid for breakthrough pain  Assessment and Plan: Calvin [name] is a 68 y.o. male with a history of asthma, prostate cancer, COPD, cervical disc protrusion, presenting with acute left-sided intra-trochanteric femur fracture.   # Left sided intra-trochanteric femur fracture - s/p fall resulting from presyncopal event earlier today resulting in hip fracture. S/p L IM nail 10/30. 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 Patient denies true syncope. 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 - Per PCP Dr Baldemar Lenis [facility] -  Previously documented RBBB. PFTs 8 years ago normal. Last echo 2005 with normal EF.  - Echo ordered - Cardiology signed off - monitoring on tele - given this is cough-induced presyncope will treat cough as COPD exacerbation per below   # COPD exacerbation - long history of smoking and recent worsening of cough with CXR revealing chronic bronchitic changes. Now with increasing white sputum.  - Continue prednisone 50 mg daily for 5 days (10/29-) - Continue doxy 100 mg BID  - albuterol nebs  q6/q2 prn  - consider addition of inhaled corticosteroid at discharge   # Hemoptysis - patient reported coughing up scant amount of blood intermittently for past 2 weeks, occurring here in hospital as well. Potentially related to COPD exacerbation. No evidence of PNA on CXR. Given smoking history concern for malignant process. Hemoglobin 12.4>11.1, likely 2/2 blood loss during surgery.  -monitor for further hemoptysis  And monitor hemoglobin -CT chest with contrast: mild subcarinal LAD only -Continue tessalon pearls 100mg  TID prn  # Cervical disc protrusion and radiculopathy - Patient reported history of this and no recent worsening. Likely this is the cause of his intermittent bilateral hands numbness.  -continue gabapentin at this time  -last MRI done 2 years ago per patient; can f/u as outpatient for this  # Hx of prostate cancer - previously treated.  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 INR is normal.  -will monitor CBCs closely and monitor for bleeding at surgical site -scds for DVT prophylaxis, though surgery started Lovenox (will discuss this today)  FEN/GI: 1/2NS 75 mL/hr, regular diet PPx: Lovenox  Disposition: discharge pending improvement in pain and w/u of hemoptysis  Subjective:  Feeling well this am, no more hemoptysis, pain well controlled  Objective: Temp:  [97.8 F (36.6 C)-98.2 F (36.8 C)] 97.8 F (36.6 C) (11/01 0615) Pulse Rate:  [104-105] 105 (11/01 0615) Resp:  [16-18] 16 (11/01 0615) BP: (104-118)/(52-84) 104/84 mmHg (11/01 0615) SpO2:  [88 %-98 %] 96 % (11/01 0615)  Physical Exam: General: NAD, resting in bed Cardiovascular: RRR, no MRG Respiratory: CTAB,  no wheezes or crackles Abdomen: NABS, soft NTND, no masses or HSM Extremities: Bandage in place c/d/i over L hip, no bruising visible. WWP, no edema, 2+ dorsalis pedis pulses Neuro: alert and  oriented, no focal deficits  Laboratory:   Recent Labs Lab 07/06/14 1254 07/07/14 0720 07/08/14 0625  WBC 11.8* 14.7* 12.1*  HGB 12.8* 11.1* 10.3*  HCT 39.2 34.2* 30.8*  PLT 271 279 238    Recent Labs Lab 07/06/14 0610 07/07/14 0720 07/08/14 0625  NA 139 136* 137  K 4.9 4.8 4.1  CL 102 100 103  CO2 28 25 25   BUN 19 21 23   CREATININE 1.19 1.00 1.01  CALCIUM 8.6 8.7 8.3*  PROT 7.0  --   --   BILITOT 0.5  --   --   ALKPHOS 63  --   --   ALT 9  --   --   AST 13  --   --   GLUCOSE 137* 144* 114*   INR 1.11  Imaging/Diagnostic Tests: Dg Chest 1 View  07/05/2014 CLINICAL DATA: Cough. Fall with left hip pain. EXAM: CHEST - 1 VIEW COMPARISON: None visible FINDINGS: Probable cardiomegaly, although there is distortion from leftward rotation. No gross upper mediastinal contour abnormality. Diffuse interstitial coarsening which is likely bronchitic, with hyperinflation. These findings were also reported on chest x-ray from 02/28/2014, images currently not available. No pulmonary edema, effusion, or pneumothorax. No evidence of fracture. IMPRESSION: Chronic bronchitic changes. No edema or pneumonia. Electronically Signed By: Jorje Guild M.D. On: 07/05/2014 19:15   Dg Hip Complete Left  07/05/2014 CLINICAL DATA: Fall on left hip, left hip pain EXAM: LEFT HIP - COMPLETE 2+ VIEW COMPARISON: None. FINDINGS: Intertrochanteric left hip fracture, minimally displaced. Mildly displaced lesser trochanteric fragment. Visualized bony pelvis appears intact. Brachytherapy seeds overlying the prostate. IMPRESSION: Intertrochanteric left hip fracture, as above. Electronically Signed By: Julian Hy M.D. On: 07/05/2014 19:21   Dg Femur Left  07/05/2014 CLINICAL DATA: Dizzy, fall today onto hip. EXAM: LEFT FEMUR - 2 VIEW COMPARISON: None. FINDINGS: Skeleton fracture of the distal left femur. No dislocation. IMPRESSION: No fracture or dislocation. Electronically Signed By: Suzy Bouchard M.D.  On: 07/05/2014 19:13    Frazier Richards, MD, MPH PGY-2,  [facility] 07/08/2014 9:00 AM  FPTS Intern pager: [phone], text pages welcome

## 2014-07-08 NOTE — Progress Notes (Addendum)
Patient with burning,sharp pain lower epigastric area - unrelieved by Mylanta, Protonix. Patient also 3 days no BM, abd. distended,hypoactive BS. Doesn't think can hold a suppository or Fleets. Dr Sherril Cong notified - orders received. VSS.

## 2014-07-08 NOTE — Progress Notes (Signed)
Subjective: 2 Days Post-Op Procedure(s) (LRB): INTRAMEDULLARY (IM) NAIL INTERTROCHANTRIC Left (Left) Patient reports pain as 1 on 0-10 scale.  No nausea/vomiting, lightheadedness/dizziness.  Negative flatus and bm.  Tolerating diet.    Objective: Vital signs in last 24 hours: Temp:  [97.8 F (36.6 C)-98.2 F (36.8 C)] 97.8 F (36.6 C) (11/01 0615) Pulse Rate:  [104-105] 105 (11/01 0615) Resp:  [16-18] 16 (11/01 0615) BP: (104-118)/(52-84) 104/84 mmHg (11/01 0615) SpO2:  [88 %-98 %] 97 % (11/01 0942)  Intake/Output from previous day: 10/31 0701 - 11/01 0700 In: 480 [P.O.:480] Out: 1250 [Urine:1250] Intake/Output this shift: Total I/O In: 0  Out: 150 [Urine:150]   Recent Labs  07/05/14 1804 07/06/14 1254 07/07/14 0720 07/08/14 0625  HGB 13.3 12.8* 11.1* 10.3*    Recent Labs  07/07/14 0720 07/08/14 0625  WBC 14.7* 12.1*  RBC 3.90* 3.59*  HCT 34.2* 30.8*  PLT 279 238    Recent Labs  07/07/14 0720 07/08/14 0625  NA 136* 137  K 4.8 4.1  CL 100 103  CO2 25 25  BUN 21 23  CREATININE 1.00 1.01  GLUCOSE 144* 114*  CALCIUM 8.7 8.3*    Recent Labs  07/05/14 1804  INR 1.11    Neurologically intact Neurovascular intact Sensation intact distally Intact pulses distally Dorsiflexion/Plantar flexion intact Incision: dressing C/D/I No cellulitis present Compartment soft  Negative homans bilaterally  Assessment/Plan: 2 Days Post-Op Procedure(s) (LRB): INTRAMEDULLARY (IM) NAIL INTERTROCHANTRIC Left (Left) Advance diet Up with therapy  WBAT LLE Dry dressing change prn ABLA-mild, patient asymptomatic Continue plan per medicine  ANTON, M. LINDSEY 07/08/2014, 10:24 AM

## 2014-07-09 ENCOUNTER — Encounter: Payer: Self-pay | Admitting: Gastroenterology

## 2014-07-09 ENCOUNTER — Encounter (HOSPITAL_COMMUNITY): Payer: Self-pay | Admitting: Orthopedic Surgery

## 2014-07-09 DIAGNOSIS — R55 Syncope and collapse: Secondary | ICD-10-CM | POA: Insufficient documentation

## 2014-07-09 LAB — VITAMIN D 25 HYDROXY (VIT D DEFICIENCY, FRACTURES): VIT D 25 HYDROXY: 32 ng/mL (ref 30–89)

## 2014-07-09 LAB — CBC
HEMATOCRIT: 29.7 % — AB (ref 39.0–52.0)
HEMOGLOBIN: 9.7 g/dL — AB (ref 13.0–17.0)
MCH: 28.6 pg (ref 26.0–34.0)
MCHC: 32.7 g/dL (ref 30.0–36.0)
MCV: 87.6 fL (ref 78.0–100.0)
Platelets: 268 10*3/uL (ref 150–400)
RBC: 3.39 MIL/uL — ABNORMAL LOW (ref 4.22–5.81)
RDW: 14.2 % (ref 11.5–15.5)
WBC: 11.1 10*3/uL — ABNORMAL HIGH (ref 4.0–10.5)

## 2014-07-09 MED ORDER — GI COCKTAIL ~~LOC~~
30.0000 mL | Freq: Two times a day (BID) | ORAL | Status: DC | PRN
  Administered 2014-07-09: 30 mL via ORAL
  Filled 2014-07-09 (×3): qty 30

## 2014-07-09 MED ORDER — SENNA 8.6 MG PO TABS
2.0000 | ORAL_TABLET | Freq: Two times a day (BID) | ORAL | Status: DC
  Administered 2014-07-09 – 2014-07-10 (×3): 17.2 mg via ORAL
  Filled 2014-07-09 (×4): qty 2

## 2014-07-09 MED ORDER — POLYETHYLENE GLYCOL 3350 17 G PO PACK
17.0000 g | PACK | Freq: Two times a day (BID) | ORAL | Status: DC
  Administered 2014-07-09 – 2014-07-10 (×2): 17 g via ORAL
  Filled 2014-07-09 (×4): qty 1

## 2014-07-09 NOTE — Progress Notes (Signed)
Occupational Therapy Treatment and Discharge Patient Details Name: Calvin [name] MRN: [id number] DOB: [birth year] Today's Date: 07/09/2014    History of present illness 68 y.o. male s/p left intertrochanteric IM nailing after sustaining a fracture from a near-syncopal episode.   OT comments  This 68 yo male admitted and underwent above is making progress today with all acute OT education completed and follow up recommendation now being [place]. Acute OT will sign off.  Follow Up Recommendations  Supervision/Assistance - 24 hour;Home health OT    Equipment Recommendations  3 in 1 bedside comode       Precautions / Restrictions Precautions Precautions: Fall Restrictions Weight Bearing Restrictions: No LLE Weight Bearing: Weight bearing as tolerated       Mobility Bed Mobility               General bed mobility comments: Pt up in recliner upon my arrival  Transfers Overall transfer level: Needs assistance Equipment used: Rolling walker (2 wheeled) Transfers: Sit to/from Stand Sit to Stand: Min guard                  ADL Overall ADL's : Needs assistance/impaired                     Lower Body Dressing: Min guard;With adaptive equipment;Sit to/from stand (for donning slippers)           Tub/ Shower Transfer: Walk-in shower;Supervision/safety;Ambulation;Rolling walker;Shower seat;Grab bars     General ADL Comments: Pt politely declines the need to practice the sock aid. He says he will have his son get a sock aid and a reacher from the gift shopHe reports the he will not try to carry items while he still using his RW--I made him aware that he can purchase a canvas nail apron from [place] or [employer] to use on the front of his RW to carry small items like (a bottle of water, his cell phone, etc..)                Cognition   Behavior During Therapy: WFL for tasks assessed/performed Overall Cognitive Status: Within Functional Limits for tasks  assessed                                    Pertinent Vitals/ Pain       Pain Assessment: No/denies pain         Frequency Min 2X/week     Progress Toward Goals  OT Goals(current goals can now be found in the care plan section)  Progress towards OT goals:  (All education completed from an acute OT standpoint)     Plan Discharge plan needs to be updated       End of Session Equipment Utilized During Treatment: Gait belt;Rolling walker   Activity Tolerance Patient tolerated treatment well   Patient Left in chair;with call bell/phone within reach;with family/visitor present           Time: 5361-4431 OT Time Calculation (min): 18 min  Charges: OT General Charges $OT Visit: 1 Procedure OT Treatments $Self Care/Home Management : 8-22 mins  Almon Register [phone] 07/09/2014, 5:01 PM

## 2014-07-09 NOTE — Progress Notes (Signed)
Family Medicine Teaching Service Daily Progress Note Intern Pager: [phone]  Patient name: Calvin [name] Medical record number: [id number] Date of birth: [birth year] Age: 68 y.o. Gender: male  Primary Care Provider: PROVIDER NOT IN SYSTEM (Dr. Baldemar Lenis at [facility] in [place]) Consultants: ortho Code Status: FULL  Pt Overview and Major Events to Date:  07/05/14: patient admitted with intra-trochanteric femur fracture, pain well-controlled with IV morphine and IV dilaudid for breakthrough pain  Assessment and Plan: Calvin [name] is a 68 y.o. male with a history of asthma, prostate cancer, COPD, cervical disc protrusion, presenting with acute left-sided intra-trochanteric femur fracture.   # Left sided intra-trochanteric femur fracture - s/p fall resulting from presyncopal event earlier today resulting in hip fracture. S/p L IM nail 10/30. Hgb 12.8 >11.1>10.3>10.8>9.7, likely operative blood loss and Factor X deficiency, seems to have leveled off near 10. 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 Patient denies true syncope. 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 - Per PCP Dr Baldemar Lenis [facility] -  Previously documented RBBB. PFTs 8 years ago normal. Last echo 2005 with normal EF.  - Echo - EF 85-46%, grade 1 diastolic dysfunction, mild-mod tricuspid regurg - Cardiology signed off - monitoring on tele - given this is cough-induced presyncope will treat cough as COPD exacerbation per below   # COPD exacerbation - long history of smoking and recent worsening of cough with CXR  revealing chronic bronchitic changes. Now with increasing white sputum. Still on O2 - Continue prednisone 50 mg daily for 5 days (10/30-)  - Continue doxy 100 mg BID  - albuterol nebs q6/q2 prn  - consider addition of inhaled corticosteroid at discharge   # Hemoptysis - patient reported coughing up scant amount of blood intermittently for past 2 weeks, occurring here in hospital as well. Potentially related to COPD exacerbation. No evidence of PNA on CXR. Given smoking history concern for malignant process. Hemoglobin 12.4>11.1, likely 2/2 blood loss during surgery.  -monitor for further hemoptysis  And monitor hemoglobin -CT chest with contrast: mild subcarinal LAD only -Continue tessalon pearls 100mg  TID prn  # GERD, Constipation: Stable. s/p GI cocktail and miralax yesterday - Continue Senna (incr to 2 tablets BID), Miralax (increase to BID), Colace - Continue protonix, Mylanta, repeat GI cocktail  # Cervical disc protrusion and radiculopathy - Patient reported history of this and no recent worsening. Likely this is the cause of his intermittent bilateral hands numbness.  -continue gabapentin at this time  -last MRI done 2 years ago per patient; can f/u as outpatient for this  # Hx of prostate cancer - previously treated.  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 INR is normal.  -will monitor CBCs closely and monitor for bleeding at surgical site -scds for DVT prophylaxis, though surgery started Lovenox   FEN/GI: KVO, regular diet PPx: Lovenox  Disposition: discharge pending improvement in pain and w/u of hemoptysis  Subjective:  No more hemoptysis, pain well controlled. Does have continued reflux symptoms.  GI cocktail helped yesterday.   He would like something else for constipation.    Objective: Temp:  [97.9 F (36.6 C)-99 F (37.2 C)] 97.9 F (36.6 C) (11/02 0800) Pulse Rate:   [87-113] 113 (11/02 0800) Resp:  [16-20] 16 (11/02 0519) BP: (115-144)/(52-74) 128/62 mmHg (11/02 0800) SpO2:  [96 %-100 %] 96 % (11/02 0800)  Physical Exam: General: NAD, resting in bed Cardiovascular: RRR, no MRG Respiratory: CTAB, no wheezes or crackles Abdomen: NABS, soft NTND, no masses or HSM Extremities: Bandage in place c/d/i over L hip, no bruising visible. WWP, no edema, 2+ dorsalis pedis pulses Neuro: alert and oriented, no focal deficits  Laboratory:   Recent Labs Lab 07/08/14 0625 07/08/14 1641 07/09/14 0553  WBC 12.1* 13.7* 11.1*  HGB 10.3* 10.8* 9.7*  HCT 30.8* 32.8* 29.7*  PLT 238 282 268    Recent Labs Lab 07/06/14 0610 07/07/14 0720 07/08/14 0625  NA 139 136* 137  K 4.9 4.8 4.1  CL 102 100 103  CO2 28 25 25   BUN 19 21 23   CREATININE 1.19 1.00 1.01  CALCIUM 8.6 8.7 8.3*  PROT 7.0  --   --   BILITOT 0.5  --   --   ALKPHOS 63  --   --   ALT 9  --   --   AST 13  --   --   GLUCOSE 137* 144* 114*   INR 1.11  TSH 1.780  Imaging/Diagnostic Tests: Dg Chest 1 View  (07/05/2014):  Chronic bronchitic changes. No edema or pneumonia.   Dg Hip Complete Left (07/05/2014):   Intertrochanteric left hip fracture, as above.  Dg Femur Left (07/05/2014):   No fracture or dislocation.   CT Chest (10/31): Moderate pulmonary emphysema and dependent bibasilar atelectasis.  Tiny bilateral pleural effusions.  Mild nonspecific subcarinal lymphadenopathy measuring 1.6 cm.  Small hiatal hernia and distal esophageal wall thickening. Although this is likely due to esophagitis, esophageal carcinoma cannot be excluded. Consider endoscopy for further evaluation.    Lavon Paganini, MD, MPH PGY-1,  [facility] 07/09/2014 9:48 AM  FPTS Intern pager: [phone], text pages welcome

## 2014-07-09 NOTE — Progress Notes (Signed)
Physical Therapy Treatment Patient Details Name: Calvin [name] MRN: [id number] DOB: 02/11/1946 Today's Date: 07/09/2014    History of Present Illness 68 y.o. male s/p left intertrochanteric IM nailing after sustaining a fracture from a near-syncopal episode.    PT Comments    Pt responded well to verbal cueing for improved gait with RW.  Some SOB noted with o2 sats 93-95%.  Follow Up Recommendations  Home health PT;Supervision/Assistance - 24 hour     Equipment Recommendations  Rolling walker with 5" wheels;3in1 (PT)    Recommendations for Other Services       Precautions / Restrictions Precautions Precautions: Fall Restrictions Weight Bearing Restrictions: Yes LLE Weight Bearing: Weight bearing as tolerated    Mobility  Bed Mobility   Bed Mobility: Supine to Sit     Supine to sit: Supervision;HOB elevated     General bed mobility comments: Able to move L LE today with decreased A.  Transfers Overall transfer level: Needs assistance Equipment used: Rolling walker (2 wheeled) Transfers: Sit to/from Stand Sit to Stand: Min guard         General transfer comment: cues for hand/foot placement.  Ambulation/Gait Ambulation/Gait assistance: Min guard Ambulation Distance (Feet): 120 Feet Assistive device: Rolling walker (2 wheeled) Gait Pattern/deviations: Decreased step length - left;Decreased stride length;Decreased weight shift to left     General Gait Details: Cues to not pick up RW at beginning of gait and with cues, he was able to correct.  o2 93-95% on room air with some SOB near end of gait.   Stairs            Wheelchair Mobility    Modified Rankin (Stroke Patients Only)       Balance     Sitting balance-Leahy Scale: Good     Standing balance support: Bilateral upper extremity supported Standing balance-Leahy Scale: Fair (with RW)                      Cognition Arousal/Alertness: Awake/alert Behavior During Therapy:  WFL for tasks assessed/performed Overall Cognitive Status: Within Functional Limits for tasks assessed                      Exercises General Exercises - Lower Extremity Ankle Circles/Pumps: AROM;Both;10 reps;Supine Quad Sets: Left;10 reps;Supine Gluteal Sets: 10 reps;Supine Heel Slides: AROM;Left;10 reps;Supine Hip ABduction/ADduction: AAROM;Left;10 reps;Supine    General Comments        Pertinent Vitals/Pain Pain Assessment: 0-10 Pain Score: 2  Pain Location: L hip Pain Descriptors / Indicators: Sore Pain Intervention(s): Repositioned;Monitored during session    Home Living                      Prior Function            PT Goals (current goals can now be found in the care plan section) Acute Rehab PT Goals Patient Stated Goal: Go home PT Goal Formulation: With patient Time For Goal Achievement: 07/14/14 Potential to Achieve Goals: Good Progress towards PT goals: Progressing toward goals    Frequency  Min 6X/week    PT Plan Current plan remains appropriate    Co-evaluation             End of Session Equipment Utilized During Treatment: Gait belt Activity Tolerance: Patient tolerated treatment well Patient left: in chair;with call bell/phone within reach     Time: 0943-1009 PT Time Calculation (min): 26 min  Charges:  $Gait Training:  8-22 mins $Therapeutic Exercise: 8-22 mins                    G Codes:      Gillis Boardley LUBECK 07/09/2014, 11:56 AM

## 2014-07-09 NOTE — Progress Notes (Signed)
CARE MANAGEMENT NOTE 07/09/2014  Patient:  [name],[name]   Account Number:  [id number]  Date Initiated:  07/07/2014  Documentation initiated by:  Calvin [name]  Subjective/Objective Assessment:   Pt admitted on 07/06/14 with a L hip fx after falling. Pt had L Hip IM Nailing.     Action/Plan:   Pt will require short term rehab at SNF.  patient refusing SNF,  PT/OT now recommending HHPT/OT   Anticipated DC Date:  07/10/2014   Anticipated DC Plan:  Calvin [name]  In-house referral  Clinical Social Worker      DC Forensic scientist  CM consult      [facility] Choice  [place]   Choice offered to / List presented to:  C-1 Patient   DME arranged  3-N-1  Vassie Moselle      DME agency  [place] arranged  [place]   Status of service:  In process, will continue to follow Medicare Important Message given?  YES (If response is "NO", the following Medicare IM given date fields will be blank) Date Medicare IM given:  07/09/2014 Medicare IM given by:  [facility] Date Additional Medicare IM given:   Additional Medicare IM given by:    Discharge Disposition:    Per UR Regulation:    If discussed at Long Length of Stay Meetings, dates discussed:    Comments:  07/09/14 Spoke with patient about HHC, he selected [name]. Contacted Calvin [name] with [facility] and set up [facility] [facility]. Patient states that he will have assistance 24/7. Contacted Calvin [name] with [name] and requested rolling walker and 3N1 be delivered to patient's room prior to discharge. CM will continue to follow. Calvin [name], Calvin [name], Calvin [name]

## 2014-07-09 NOTE — Progress Notes (Signed)
Patient ID: Calvin [name], male   DOB: 12/13/1945, 68 y.o.   MRN: [id number]     Subjective:  Patient reports pain as mild to moderate.  Patient sitting up in the chair and in no acute distress.  Asking about going home  Objective:   VITALS:   Filed Vitals:   07/08/14 2240 07/09/14 0519 07/09/14 0740 07/09/14 0800  BP:  115/64  128/62  Pulse: 96 87  113  Temp:  98.2 F (36.8 C)  97.9 F (36.6 C)  TempSrc:  Oral  Oral  Resp: 20 16    SpO2: 98% 99% 96% 96%     Lab Results  Component Value Date   WBC 11.1* 07/09/2014   HGB 9.7* 07/09/2014   HCT 29.7* 07/09/2014   MCV 87.6 07/09/2014   PLT 268 07/09/2014   BMET    Component Value Date/Time   NA 137 07/08/2014 0625   K 4.1 07/08/2014 0625   CL 103 07/08/2014 0625   CO2 25 07/08/2014 0625   GLUCOSE 114* 07/08/2014 0625   BUN 23 07/08/2014 0625   CREATININE 1.01 07/08/2014 0625   CALCIUM 8.3* 07/08/2014 0625   GFRNONAA 73* 07/08/2014 0625   GFRAA 85* 07/08/2014 0625   Sensation intact left lower ext Wound clean dry and intact Good knee ankle and foot function   Assessment/Plan: 3 Days Post-Op   Principal Problem:   Intertrochanteric fracture of left hip Active Problems:   Hemoptysis   Cough syncope syndrome   Bifascicular block   Osteoporosis   Pre-operative cardiovascular examination   Advance diet Up with therapy Continue plan per medicine Okay for DC from an ortho standpoint Follow up with Dr Marchia Bond in two weeks   Rande Brunt, Erlene Quan 07/09/2014, 2:07 PM  Seen and agree with above.  dispo per primary team.   Marchia Bond, MD Cell [phone]

## 2014-07-10 LAB — BASIC METABOLIC PANEL
Anion gap: 10 (ref 5–15)
BUN: 24 mg/dL — AB (ref 6–23)
CALCIUM: 8.2 mg/dL — AB (ref 8.4–10.5)
CO2: 28 meq/L (ref 19–32)
Chloride: 102 mEq/L (ref 96–112)
Creatinine, Ser: 1.01 mg/dL (ref 0.50–1.35)
GFR calc Af Amer: 85 mL/min — ABNORMAL LOW (ref >90)
GFR, EST NON AFRICAN AMERICAN: 73 mL/min — AB (ref >90)
Glucose, Bld: 93 mg/dL (ref 70–99)
Potassium: 4 mEq/L (ref 3.7–5.3)
Sodium: 140 mEq/L (ref 137–147)

## 2014-07-10 LAB — CBC
HEMATOCRIT: 29.3 % — AB (ref 39.0–52.0)
Hemoglobin: 9.7 g/dL — ABNORMAL LOW (ref 13.0–17.0)
MCH: 28.3 pg (ref 26.0–34.0)
MCHC: 33.1 g/dL (ref 30.0–36.0)
MCV: 85.4 fL (ref 78.0–100.0)
Platelets: 256 10*3/uL (ref 150–400)
RBC: 3.43 MIL/uL — AB (ref 4.22–5.81)
RDW: 14.3 % (ref 11.5–15.5)
WBC: 11 10*3/uL — ABNORMAL HIGH (ref 4.0–10.5)

## 2014-07-10 MED ORDER — DOXYCYCLINE HYCLATE 100 MG PO TABS: 100.0000 mg | ORAL_TABLET | Freq: Two times a day (BID) | ORAL | Status: DC

## 2014-07-10 MED ORDER — MOMETASONE FURO-FORMOTEROL FUM 100-5 MCG/ACT IN AERO: 2.0000 | INHALATION_SPRAY | Freq: Two times a day (BID) | RESPIRATORY_TRACT | Status: DC

## 2014-07-10 MED ORDER — LIDOCAINE VISCOUS 2 % MT SOLN: 20.0000 mL | OROMUCOSAL | Status: DC | PRN

## 2014-07-10 MED ORDER — PANTOPRAZOLE SODIUM 40 MG PO TBEC: 40.0000 mg | DELAYED_RELEASE_TABLET | Freq: Two times a day (BID) | ORAL | Status: AC

## 2014-07-10 MED ORDER — ALBUTEROL SULFATE HFA 108 (90 BASE) MCG/ACT IN AERS: 2.0000 | INHALATION_SPRAY | RESPIRATORY_TRACT | Status: AC | PRN

## 2014-07-10 MED ORDER — SUCRALFATE 1 GM/10ML PO SUSP: 1.0000 g | Freq: Three times a day (TID) | ORAL | Status: DC

## 2014-07-10 MED ORDER — BENZONATATE 100 MG PO CAPS: 100.0000 mg | ORAL_CAPSULE | Freq: Three times a day (TID) | ORAL | Status: DC | PRN

## 2014-07-10 NOTE — Progress Notes (Signed)
Physical Therapy Treatment Patient Details Name: Calvin [name] MRN: [id number] DOB: May 24, 1946 Today's Date: 07/10/2014    History of Present Illness 69 y.o. male s/p left intertrochanteric IM nailing after sustaining a fracture from a near-syncopal episode.    PT Comments    Pt scheduled to go home this afternoon.  Reviewed LE therex program and home safety.  Cont to recommend HHPT.  Follow Up Recommendations  Home health PT     Equipment Recommendations  Rolling walker with 5" wheels;3in1 (PT)    Recommendations for Other Services       Precautions / Restrictions Precautions Precautions: Fall Restrictions Weight Bearing Restrictions: Yes LLE Weight Bearing: Weight bearing as tolerated    Mobility  Bed Mobility               General bed mobility comments: Pt up in recliner upon my arrival  Transfers   Equipment used: Rolling walker (2 wheeled) Transfers: Sit to/from Stand Sit to Stand: Supervision         General transfer comment: good use of hand placement  Ambulation/Gait Ambulation/Gait assistance: Min guard;Supervision Ambulation Distance (Feet): 120 Feet Assistive device: Rolling walker (2 wheeled) Gait Pattern/deviations: Decreased step length - left     General Gait Details: improved gait pattern with decreased SOB today   Stairs            Wheelchair Mobility    Modified Rankin (Stroke Patients Only)       Balance     Sitting balance-Leahy Scale: Good       Standing balance-Leahy Scale: Fair (with RW)                      Cognition Arousal/Alertness: Awake/alert Behavior During Therapy: WFL for tasks assessed/performed Overall Cognitive Status: Within Functional Limits for tasks assessed                      Exercises      General Comments        Pertinent Vitals/Pain Pain Assessment: No/denies pain    Home Living                      Prior Function            PT Goals  (current goals can now be found in the care plan section) Acute Rehab PT Goals Patient Stated Goal: Go home PT Goal Formulation: With patient Time For Goal Achievement: 07/14/14 Potential to Achieve Goals: Good Progress towards PT goals: Progressing toward goals    Frequency  Min 6X/week    PT Plan Current plan remains appropriate    Co-evaluation             End of Session           Time: 1208-1220 PT Time Calculation (min): 12 min  Charges:  $Gait Training: 8-22 mins                    G Codes:      Nicholad Kautzman LUBECK 07/10/2014, 12:39 PM

## 2014-07-10 NOTE — Progress Notes (Signed)
Patient provided with discharge instructions and follow up information. He is set up with [facility]. He is going home at this time with his son.

## 2014-07-10 NOTE — Discharge Summary (Signed)
[facility] Discharge Summary  Patient name: Calvin [name] Medical record number: [id number] Date of birth: 07-03-1946 Age: 68 y.o. Gender: male Date of Admission: 07/05/2014  Date of Discharge: 07/10/2014 Admitting Physician: Blane Ohara McDiarmid, MD  Primary Care Provider: PROVIDER NOT IN SYSTEM (Dr. Baldemar Lenis at [facility] in [place]) Consultants: cardiology, orthopedics  Indication for Hospitalization: acute hip fracture  Discharge Diagnoses/Problem List:  - Hip fracture - Hx of prostate cancer, treated with brachytherapy - Cervical spinal stenosis - Cough syncope syndrome - COPD exacerbation - esophageal wall thickening on CT  Disposition: to home with home health (patient declined SNF)  Discharge Condition: stable  Discharge Exam:  Filed Vitals:   07/10/14 0600  BP: 132/67  Pulse: 84  Temp: 98.4 F (36.9 C)  Resp:   Physical Exam: General: NAD, resting in bed Cardiovascular: RRR, no MRG Respiratory: faint crackles at bases bilaterally, otherwise CTAB Abdomen: NABS, soft NTND, no masses or HSM Extremities: Bandage in place c/d/i over L hip, no bruising visible. WWP, no edema, 2+ dorsalis pedis pulses Neuro: alert and oriented, no focal deficits   Brief Hospital Course:  Calvin [name] is a 68 y.o. male with a history of asthma, COPD, prostate cancer, cervical disc protrusion and cervical spinal stenosis, presenting with acute left-sided intra-trochanteric femur fracture in setting of fall in setting of pre-syncope. His hospital course is outline by problem below:  #Left intr-trochanteric femur fracture, acute: Presented with acute fracture s/p fall in setting of pre-syncope (this problem is outlined below). Pain was managed adequately and patient underwent surgical repair on 07/06/14. Patient did well post-op and will f/u with ortho in 2 weeks.  #Presyncope: Patient presenting with hx of intermittent feelings of lightheadedness and  like he might pass out when having acute coughing spells. No actual loss of consciousness. Presyncope felt to be most likely caused by Cough Syncope Syndrome. Patient with history of RBBB. On EKG here, RBBB and LPFB seen. Given potential for cardiac etiology of presyncope, cardiology was consulted and recommended that he was safe for surgery and should receive echo post-operatively. Echo showed mild-moderate tricuspid regurgitation and an EF of 55-60%. Patient with no further lightheadedness, presyncope symptoms during hospitalization.   #Cough and hemoptysis: Patient with 4 month history of increased cough and production of white sputum and 2 week history of scant hemoptysis. Given length of symptoms, significant smoking history, as well as hx of prostate cancer, CT chest was done to better visualize lungs and showed mild subcarinal LAD only. Patient treated for COPD exacerbation with doxycycline, prednisone, albuterol nebulizer, Dulera, and tessalon pearls for cough. Cough much improved and hemoptysis resolved by day of discharge. Will continue Dulera at home and finish course of doxy.   #Cervical disc protrusion, cervical spinal stenosis: Patient with history of this causing intermittent numbness in hands and subjective feeling of weakness in hands, however with full strength on our exam here. Continued on home gabapentin. No recent worsening or worsening during hospitalization.  #Hx of prostate cancer: Patient with history of prostate cancer treated with brachytherapy. He was continued on his home proscar and flomax.   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 [facility] did not have documentation of this. INR 1.1 on admission. We did not use anticoagulation given possible bleeding risk. Blood counts monitored closely peri-operatively. Hb/Hct stable at 9.7 on day of discharge.   #Odynophagia: Pain on swallowing that  started  same day patient admitted. Patient started on PPI and Carafate. CT chest showed small hiatal hernia and esophageal wall thickening as below. He will f/u with GI to further work up this odynophagia and possible esophagitis.      Issues for Follow Up:  - f/u resolution of COPD exacerbation - f/u mobility and pain given recent hip fracture - GI f/u given thickening of esophagus wall seen on CT - f/u hemoptysis   Significant Procedures:  -Repair of left intra-trochanteric femoral fracture (07/06/2014)  Significant Labs and Imaging:   Recent Labs Lab 07/08/14 1641 07/09/14 0553 07/10/14 0652  WBC 13.7* 11.1* 11.0*  HGB 10.8* 9.7* 9.7*  HCT 32.8* 29.7* 29.3*  PLT 282 268 256    Recent Labs Lab 07/05/14 1804 07/06/14 0610 07/07/14 0720 07/08/14 0625 07/10/14 0652  NA 136* 139 136* 137 140  K 4.6 4.9 4.8 4.1 4.0  CL 100 102 100 103 102  CO2 22 28 25 25 28   GLUCOSE 107* 137* 144* 114* 93  BUN 16 19 21 23  24*  CREATININE 1.06 1.19 1.00 1.01 1.01  CALCIUM 9.0 8.6 8.7 8.3* 8.2*  ALKPHOS  --  63  --   --   --   AST  --  13  --   --   --   ALT  --  9  --   --   --   ALBUMIN  --  3.1*  --   --   --     1. TTE: - Left ventricle: The cavity size was normal. Systolic function was normal. The estimated ejection fraction was in the range of 55% to 60%. Although no diagnostic regional wall motion abnormality was identified, this possibility cannot be completely excluded on the basis of this study. Doppler parameters are consistent with abnormal left ventricular relaxation (grade 1 diastolic dysfunction). - Tricuspid valve: There was mild-moderate regurgitation directed centrally. - Pulmonary arteries: Systolic pressure was mildly increased. PA peak pressure: 41 mm Hg (S).  2. Moderate pulmonary emphysema and dependent bibasilar atelectasis. Tiny bilateral pleural effusions. Mild nonspecific subcarinal lymphadenopathy measuring 1.6 cm. Small hiatal hernia and distal  esophageal wall thickening. Although this is likely due to esophagitis, esophageal carcinoma cannot be excluded. Consider endoscopy for further evaluation.  3. Pelvis Xray: A medullary rod is now noted in the proximal left femur with a compression screw traversing the femoral neck. The fracture fragments are in near anatomic alignment. No other focal abnormality is noted. Prostate seeds are again seen.  Results/Tests Pending at Time of Discharge: none  Discharge Medications:    Medication List    TAKE these medications        enoxaparin 40 MG/0.4ML injection  Commonly known as:  LOVENOX  Inject 0.4 mLs (40 mg total) into the skin daily.     HYDROcodone-acetaminophen 10-325 MG per tablet  Commonly known as:  NORCO  Take 1-2 tablets by mouth every 6 (six) hours as needed.     sennosides-docusate sodium 8.6-50 MG tablet  Commonly known as:  SENOKOT-S  Take 2 tablets by mouth daily.      ASK your doctor about these medications        finasteride 5 MG tablet  Commonly known as:  PROSCAR  Take 5 mg by mouth every evening.     gabapentin 300 MG capsule  Commonly known as:  NEURONTIN  Take 600 mg by mouth 3 (three) times daily.     SINGULAIR 10 MG tablet  Generic drug:  montelukast  Take 10 mg by mouth at bedtime.     tamsulosin 0.4 MG Caps capsule  Commonly known as:  FLOMAX  Take 0.4 mg by mouth daily.        Discharge Instructions: Please refer to Patient Instructions section of EMR for full details.  Patient was counseled important signs and symptoms that should prompt return to medical care, changes in medications, dietary instructions, activity restrictions, and follow up appointments.   Follow-Up Appointments:     Follow-up Information    Follow up with Johnny Bridge, MD. Schedule an appointment as soon as possible for a visit in 2 weeks.   Specialty:  Orthopedic Surgery   Contact information:   [place]  [id number] [phone]       Follow up with [facility].   Why:  They will contact you to schedule physicla therapy visits.   Contact information:   [address] [place] [address] [phone]       Follow up with Norberto Sorenson T. Fuller Plan, MD On 09/06/2014.   Specialty:  Gastroenterology   Why:  Appointment made for GI follow-up on 12/31 at [place] information:   [address] [place] [address] [phone]       Liza R Straub, Med Student 07/10/2014, 8:47 AM [facility]  I agree with the medical student note above and have edited it as necessary. I formulated the plan and performed my own physical exam which are documented above.  Beverlyn Roux, MD, MPH [facility] PGY-2 07/10/2014 5:00 PM

## 2014-07-10 NOTE — Discharge Instructions (Signed)
Diet: As you were doing prior to hospitalization   Shower:  May shower but keep the wounds dry, use an occlusive plastic wrap, NO SOAKING IN TUB.  If the bandage gets wet, change with a clean dry gauze.  Dressing:  You may change your dressing 3-5 days after surgery.  Then change the dressing daily with sterile gauze dressing.    There are sticky tapes (steri-strips) on your wounds and all the stitches are absorbable.  Leave the steri-strips in place when changing your dressings, they will peel off with time, usually 2-3 weeks.  Activity:  Increase activity slowly as tolerated, but follow the weight bearing instructions below.  No lifting or driving for 6 weeks.  Weight Bearing:   As tolerated.    To prevent constipation: you may use a stool softener such as -  Colace (over the counter) 100 mg by mouth twice a day  Drink plenty of fluids (prune juice may be helpful) and high fiber foods Miralax (over the counter) for constipation as needed.    Itching:  If you experience itching with your medications, try taking only a single pain pill, or even half a pain pill at a time.  You may take up to 10 pain pills per day, and you can also use benadryl over the counter for itching or also to help with sleep.   Precautions:  If you experience chest pain or shortness of breath - call 911 immediately for transfer to the hospital emergency department!!  If you develop a fever greater that 101 F, purulent drainage from wound, increased redness or drainage from wound, or calf pain -- Call the office at [phone]                                                Follow- Up Appointment:  Please call for an appointment to be seen in 2 weeks [place] - [phone]  Cough: Finish your doxycycline (antibiotics). Continue your [place] daily. Continue your albuterol as needed. Continue using your incentive spirometer.   Pain with swallowing/heartburn: Continue taking your protonix. Start taking Carafate. Take  the lidocaine liquid as needed. Go to your gastroenterology appointment on 12/31.

## 2014-07-10 NOTE — Progress Notes (Signed)
     Subjective:  Patient reports pain as mild.  No complaints  Objective:   VITALS:   Filed Vitals:   07/09/14 2146 07/10/14 0000 07/10/14 0600 07/10/14 0952  BP:   132/67   Pulse:   84   Temp:   98.4 F (36.9 C)   TempSrc:      Resp:  18    SpO2: 94% 94% 94% 94%    Neurologically intact Dorsiflexion/Plantar flexion intact   Lab Results  Component Value Date   WBC 11.0* 07/10/2014   HGB 9.7* 07/10/2014   HCT 29.3* 07/10/2014   MCV 85.4 07/10/2014   PLT 256 07/10/2014   BMET    Component Value Date/Time   NA 140 07/10/2014 0652   K 4.0 07/10/2014 0652   CL 102 07/10/2014 0652   CO2 28 07/10/2014 0652   GLUCOSE 93 07/10/2014 0652   BUN 24* 07/10/2014 0652   CREATININE 1.01 07/10/2014 0652   CALCIUM 8.2* 07/10/2014 0652   GFRNONAA 73* 07/10/2014 0652   GFRAA 85* 07/10/2014 0652     Assessment/Plan: 4 Days Post-Op   Principal Problem:   Intertrochanteric fracture of left hip Active Problems:   Hemoptysis   Cough syncope syndrome   Bifascicular block   Osteoporosis   Pre-operative cardiovascular examination   Faintness   Discharge home with home health Doing well.   Rumaisa Schnetzer P 07/10/2014, 10:22 AM   Marchia Bond, MD Cell [phone]

## 2014-09-06 ENCOUNTER — Ambulatory Visit: Payer: Medicare Other | Admitting: Gastroenterology

## 2014-09-19 ENCOUNTER — Ambulatory Visit: Payer: Medicare Other | Admitting: Gastroenterology

## 2014-09-19 ENCOUNTER — Ambulatory Visit: Payer: Medicare Other | Admitting: Nurse Practitioner

## 2014-09-20 NOTE — Progress Notes (Unsigned)
Patient ID: Calvin [name], male   DOB: 11-May-1946, 69 y.o.   MRN: [id number] The patient's chart has been reviewed by Dr. Fuller Plan  and the recommendations are noted below.   Follow-up advised. Contact patient and schedule visit in the next 2-3  weeks.  Outcome of communication with the patient: Unable to reach patient. Will mail letter.

## 2014-11-12 ENCOUNTER — Ambulatory Visit: Payer: Self-pay | Admitting: Gastroenterology

## 2014-11-14 ENCOUNTER — Other Ambulatory Visit: Payer: Self-pay | Admitting: Family Medicine

## 2014-12-03 ENCOUNTER — Other Ambulatory Visit: Payer: Self-pay | Admitting: Family Medicine

## 2014-12-31 LAB — SURGICAL PATHOLOGY

## 2015-07-15 ENCOUNTER — Encounter
Admission: RE | Admit: 2015-07-15 | Discharge: 2015-07-15 | Disposition: A | Discharge status: Home / Self Care | Payer: Medicare Other | Source: Ambulatory Visit | Attending: Urology | Admitting: Urology

## 2015-07-15 DIAGNOSIS — Z01812 Encounter for preprocedural laboratory examination: Secondary | ICD-10-CM | POA: Diagnosis present

## 2015-07-15 DIAGNOSIS — Z01818 Encounter for other preprocedural examination: Secondary | ICD-10-CM | POA: Diagnosis present

## 2015-07-15 LAB — BASIC METABOLIC PANEL
ANION GAP: 9 (ref 5–15)
BUN: 23 mg/dL — ABNORMAL HIGH (ref 6–20)
CALCIUM: 8.9 mg/dL (ref 8.9–10.3)
CO2: 25 mmol/L (ref 22–32)
Chloride: 106 mmol/L (ref 101–111)
Creatinine, Ser: 1.06 mg/dL (ref 0.61–1.24)
GLUCOSE: 107 mg/dL — AB (ref 65–99)
Potassium: 4.1 mmol/L (ref 3.5–5.1)
Sodium: 140 mmol/L (ref 135–145)

## 2015-07-15 LAB — DIFFERENTIAL
BASOS PCT: 1 %
Basophils Absolute: 0.1 10*3/uL (ref 0–0.1)
EOS PCT: 2 %
Eosinophils Absolute: 0.2 10*3/uL (ref 0–0.7)
LYMPHS ABS: 1.6 10*3/uL (ref 1.0–3.6)
Lymphocytes Relative: 12 %
MONOS PCT: 4 %
Monocytes Absolute: 0.5 10*3/uL (ref 0.2–1.0)
Neutro Abs: 11.1 10*3/uL — ABNORMAL HIGH (ref 1.4–6.5)
Neutrophils Relative %: 81 %

## 2015-07-15 LAB — CBC
HCT: 42.3 % (ref 40.0–52.0)
HEMOGLOBIN: 13.7 g/dL (ref 13.0–18.0)
MCH: 26.1 pg (ref 26.0–34.0)
MCHC: 32.4 g/dL (ref 32.0–36.0)
MCV: 80.6 fL (ref 80.0–100.0)
Platelets: 358 10*3/uL (ref 150–440)
RBC: 5.24 MIL/uL (ref 4.40–5.90)
RDW: 16.1 % — AB (ref 11.5–14.5)
WBC: 13.5 10*3/uL — ABNORMAL HIGH (ref 3.8–10.6)

## 2015-07-15 NOTE — Patient Instructions (Addendum)
  Your procedure is scheduled on: 07/23/15 Report to Day Surgery. To find out your arrival time please call [phone] between 1PM - 3PM on 07/22/15  Remember: Instructions that are not followed completely may result in serious medical risk, up to and including death, or upon the discretion of your surgeon and anesthesiologist your surgery may need to be rescheduled.    __x__ 1. Do not eat food or drink liquids after midnight. No gum chewing or hard candies.     __x__ 2. No Alcohol for 24 hours before or after surgery.   ____ 3. Bring all medications with you on the day of surgery if instructed.    __x__ 4. Notify your doctor if there is any change in your medical condition     (cold, fever, infections).     Do not wear jewelry, make-up, hairpins, clips or nail polish.  Do not wear lotions, powders, or perfumes. You may wear deodorant.  Do not shave 48 hours prior to surgery. Men may shave face and neck.  Do not bring valuables to the hospital.    [facility] is not responsible for any belongings or valuables.               Contacts, dentures or bridgework may not be worn into surgery.  Leave your suitcase in the car. After surgery it may be brought to your room.  For patients admitted to the hospital, discharge time is determined by your                treatment team.   Patients discharged the day of surgery will not be allowed to drive home.   Please read over the following fact sheets that you were given:   Surgical Site Infection Prevention   ____ Take these medicines the morning of surgery with A SIP OF WATER:    1. Metoprolol take as usual night before surgery  2. gabapentin  3. singular  4.norco ( Pain pill if needed)  5.protonix  6.  ____ Fleet Enema (as directed)   ____ Use CHG Soap as directed  __x__ Use inhalers on the day of surgery  ____ Stop metformin 2 days prior to surgery    ____ Take 1/2 of usual insulin dose the night before surgery and none on  the morning of surgery.   ____ Stop Coumadin/Plavix/aspirin on   ____ Stop Anti-inflammatories on    ____ Stop supplements until after surgery.    ____ Bring C-Pap to the hospital.

## 2015-07-16 MED ORDER — LACTATED RINGERS IV SOLN: INTRAVENOUS | Status: DC

## 2015-07-16 NOTE — OR Nursing (Signed)
Ekg called to Dr Ronelle Nigh and ok to proceed

## 2015-07-17 NOTE — H&P (Signed)
NAMERAYMONDO, GARCIALOPEZ NO.:  [id number]  MEDICAL RECORD NO.:  [id number]  LOCATION:  PAT                          FACILITY:  [facility]  PHYSICIAN:  Maryan Puls          DATE OF BIRTH:  Jul 15, 1946  DATE OF ADMISSION:  07/15/2015 DATE OF DISCHARGE:  07/15/2015                            HISTORY AND PHYSICAL   SAME-DAY SURGERY:  July 23, 2015.  CHIEF COMPLAINT:  Difficulty voiding.  HISTORY OF PRESENT ILLNESS:  Mr. Ardizzone is a 69 year old white male, who presented to the office with a 1-year history of difficulty voiding.  He complains of incomplete emptying, frequency, urgency and a weak stream. He is currently on finasteride and tamsulosin and has been on these for several years now.  Evaluation in the office included cystoscopy on July 03, 2105, which revealed trilobar BPH with visual obstruction and a 4-cm prostatic urethra.  The patient comes in now for photovaporization of prostate with the GreenLight laser.  PAST MEDICAL HISTORY:  The patient was allergic to aspirin.  CURRENT MEDICATIONS:  Included tamsulosin, finasteride, gabapentin, pantoprazole, baclofen, Singulair and metoprolol.  PREVIOUS SURGICAL PROCEDURES:  Include repair of fractured hip in 2015, left inguinal herniorrhaphy in 1999, right inguinal herniorrhaphy in 2007.  PAST AND CURRENT MEDICAL CONDITION: 1. Hypertension. 2. COPD. 3. Hyperlipidemia. 4. Degenerative joint disease. 5. GERD.  REVIEW OF SYSTEMS:  The patient denies chest pain, shortness of breath, diabetes, or stroke.  PHYSICAL EXAMINATION:  GENERAL:  A well-nourished white male, in no acute distress. HEENT:  Sclerae were clear. NECK:  Supple.  No palpable cervical adenopathy. LUNGS:  Clear to auscultation. CARDIOVASCULAR:  Regular in rate without audible murmurs. ABDOMEN:  Soft, nontender abdomen. GU:  Circumcised testes, atrophic. RECTAL:  40 g, smooth, nontender prostate. NEUROMUSCULAR:  Alert and oriented  x3.  IMPRESSION: 1. Benign prostatic hypertrophy with bladder outlet obstruction. 2. Prostate cancer, status post brachytherapy in 2014.  PLAN:  Photovaporization of the prostate with GreenLight laser.          ______________________________ Maryan Puls     MW/MEDQ  D:  07/16/2015  T:  07/17/2015  Job:  [id number]

## 2015-07-23 ENCOUNTER — Ambulatory Visit
Admission: RE | Admit: 2015-07-23 | Discharge: 2015-07-23 | Disposition: A | Discharge status: Home / Self Care | Payer: Medicare Other | Source: Ambulatory Visit | Attending: Urology | Admitting: Urology

## 2015-07-23 ENCOUNTER — Encounter: Payer: Self-pay | Admitting: *Deleted

## 2015-07-23 ENCOUNTER — Encounter
Admission: RE | Disposition: A | Discharge status: Home / Self Care | Payer: Self-pay | Source: Ambulatory Visit | Attending: Urology

## 2015-07-23 ENCOUNTER — Ambulatory Visit: Payer: Medicare Other | Admitting: Anesthesiology

## 2015-07-23 DIAGNOSIS — M1990 Unspecified osteoarthritis, unspecified site: Secondary | ICD-10-CM | POA: Insufficient documentation

## 2015-07-23 DIAGNOSIS — J449 Chronic obstructive pulmonary disease, unspecified: Secondary | ICD-10-CM | POA: Diagnosis not present

## 2015-07-23 DIAGNOSIS — E785 Hyperlipidemia, unspecified: Secondary | ICD-10-CM | POA: Insufficient documentation

## 2015-07-23 DIAGNOSIS — K219 Gastro-esophageal reflux disease without esophagitis: Secondary | ICD-10-CM | POA: Insufficient documentation

## 2015-07-23 DIAGNOSIS — Z87891 Personal history of nicotine dependence: Secondary | ICD-10-CM | POA: Diagnosis not present

## 2015-07-23 DIAGNOSIS — N138 Other obstructive and reflux uropathy: Secondary | ICD-10-CM

## 2015-07-23 DIAGNOSIS — C61 Malignant neoplasm of prostate: Secondary | ICD-10-CM | POA: Diagnosis not present

## 2015-07-23 DIAGNOSIS — I10 Essential (primary) hypertension: Secondary | ICD-10-CM | POA: Diagnosis not present

## 2015-07-23 DIAGNOSIS — N401 Enlarged prostate with lower urinary tract symptoms: Secondary | ICD-10-CM | POA: Diagnosis not present

## 2015-07-23 HISTORY — PX: GREEN LIGHT LASER TURP (TRANSURETHRAL RESECTION OF PROSTATE: SHX6260

## 2015-07-23 SURGERY — GREEN LIGHT LASER TURP (TRANSURETHRAL RESECTION OF PROSTATE
Anesthesia: General

## 2015-07-23 MED ORDER — LEVOFLOXACIN 500 MG PO TABS: 500.0000 mg | ORAL_TABLET | Freq: Every day | ORAL | Status: DC

## 2015-07-23 MED ORDER — LIDOCAINE HCL 2 % EX GEL
CUTANEOUS | Status: AC
  Filled 2015-07-23: qty 10

## 2015-07-23 MED ORDER — LIDOCAINE HCL (CARDIAC) 20 MG/ML IV SOLN
INTRAVENOUS | Status: DC | PRN
  Administered 2015-07-23: 45 mg via INTRAVENOUS

## 2015-07-23 MED ORDER — PROPOFOL 10 MG/ML IV BOLUS
INTRAVENOUS | Status: DC | PRN
  Administered 2015-07-23: 150 mg via INTRAVENOUS

## 2015-07-23 MED ORDER — DOCUSATE SODIUM 100 MG PO CAPS: 200.0000 mg | ORAL_CAPSULE | Freq: Two times a day (BID) | ORAL | Status: DC

## 2015-07-23 MED ORDER — SODIUM CHLORIDE 0.9 % IV SOLN
INTRAVENOUS | Status: DC
  Administered 2015-07-23: 12:00:00 via INTRAVENOUS

## 2015-07-23 MED ORDER — BELLADONNA ALKALOIDS-OPIUM 16.2-60 MG RE SUPP
RECTAL | Status: AC
  Filled 2015-07-23: qty 1

## 2015-07-23 MED ORDER — FENTANYL CITRATE (PF) 100 MCG/2ML IJ SOLN: 25.0000 ug | INTRAMUSCULAR | Status: DC | PRN

## 2015-07-23 MED ORDER — ONDANSETRON HCL 4 MG/2ML IJ SOLN
INTRAMUSCULAR | Status: DC | PRN
  Administered 2015-07-23: 4 mg via INTRAVENOUS

## 2015-07-23 MED ORDER — OXYCODONE-ACETAMINOPHEN 10-325 MG PO TABS: 1.0000 | ORAL_TABLET | ORAL | Status: DC | PRN

## 2015-07-23 MED ORDER — PHENYLEPHRINE HCL 10 MG/ML IJ SOLN
INTRAMUSCULAR | Status: DC | PRN
  Administered 2015-07-23 (×2): 100 ug via INTRAVENOUS

## 2015-07-23 MED ORDER — MIDAZOLAM HCL 5 MG/5ML IJ SOLN
INTRAMUSCULAR | Status: DC | PRN
  Administered 2015-07-23: 2 mg via INTRAVENOUS

## 2015-07-23 MED ORDER — LIDOCAINE HCL 2 % EX GEL
CUTANEOUS | Status: DC | PRN
  Administered 2015-07-23: 1

## 2015-07-23 MED ORDER — FENTANYL CITRATE (PF) 100 MCG/2ML IJ SOLN
INTRAMUSCULAR | Status: DC | PRN
  Administered 2015-07-23: 25 ug via INTRAVENOUS
  Administered 2015-07-23: 50 ug via INTRAVENOUS
  Administered 2015-07-23 (×6): 25 ug via INTRAVENOUS
  Administered 2015-07-23: 50 ug via INTRAVENOUS
  Administered 2015-07-23: 25 ug via INTRAVENOUS

## 2015-07-23 MED ORDER — ONDANSETRON HCL 4 MG/2ML IJ SOLN: 4.0000 mg | Freq: Once | INTRAMUSCULAR | Status: DC | PRN

## 2015-07-23 MED ORDER — BELLADONNA ALKALOIDS-OPIUM 16.2-60 MG RE SUPP
RECTAL | Status: DC | PRN
  Administered 2015-07-23: 1 via RECTAL

## 2015-07-23 SURGICAL SUPPLY — 29 items
ADAPTER IRRIG TUBE 2 SPIKE SOL (ADAPTER) ×6 IMPLANT
BAG URO DRAIN 2000ML W/SPOUT (MISCELLANEOUS) ×3 IMPLANT
CATH FOLEY 2WAY  5CC 20FR SIL (CATHETERS) ×2
CATH FOLEY 2WAY 5CC 20FR SIL (CATHETERS) ×1 IMPLANT
GLOVE BIO SURGEON STRL SZ7 (GLOVE) ×6 IMPLANT
GLOVE BIO SURGEON STRL SZ7.5 (GLOVE) ×3 IMPLANT
GOWN STRL REUS W/ TWL LRG LVL3 (GOWN DISPOSABLE) ×1 IMPLANT
GOWN STRL REUS W/ TWL XL LVL3 (GOWN DISPOSABLE) ×1 IMPLANT
GOWN STRL REUS W/TWL LRG LVL3 (GOWN DISPOSABLE) ×2
GOWN STRL REUS W/TWL XL LVL3 (GOWN DISPOSABLE) ×2
IV NS 1000ML (IV SOLUTION) ×2
IV NS 1000ML BAXH (IV SOLUTION) ×1 IMPLANT
IV SET PRIMARY 15D 139IN B9900 (IV SETS) ×3 IMPLANT
KIT RM TURNOVER CYSTO AR (KITS) ×3 IMPLANT
LASER GREENLIGHT XPS PROCEDURE (MISCELLANEOUS) ×3 IMPLANT
LASER GRNLGT 950 (MISCELLANEOUS) IMPLANT
LASER GRNLGT MOXY FIBER 750UM (MISCELLANEOUS) ×3 IMPLANT
PACK CYSTO AR (MISCELLANEOUS) ×3 IMPLANT
PREP PVP WINGED SPONGE (MISCELLANEOUS) ×3 IMPLANT
SET IRRIG Y TYPE TUR BLADDER L (SET/KITS/TRAYS/PACK) ×3 IMPLANT
SOL .9 NS 3000ML IRR  AL (IV SOLUTION) ×8
SOL .9 NS 3000ML IRR UROMATIC (IV SOLUTION) ×4 IMPLANT
SOL PREP PVP 2OZ (MISCELLANEOUS) ×3
SOLUTION PREP PVP 2OZ (MISCELLANEOUS) ×1 IMPLANT
SURGILUBE 2OZ TUBE FLIPTOP (MISCELLANEOUS) ×3 IMPLANT
SYRINGE IRR TOOMEY STRL 70CC (SYRINGE) ×3 IMPLANT
TUBING CONNECTING 10 (TUBING) ×2 IMPLANT
TUBING CONNECTING 10' (TUBING) ×1
WATER STERILE IRR 1000ML POUR (IV SOLUTION) ×3 IMPLANT

## 2015-07-23 NOTE — Transfer of Care (Signed)
Immediate Anesthesia Transfer of Care Note  Patient: Calvin [name]  Procedure(s) Performed: Procedure(s): GREEN LIGHT LASER TURP (TRANSURETHRAL RESECTION OF PROSTATE (N/A)  Patient Location: PACU  Anesthesia Type:General  Level of Consciousness: awake and patient cooperative  Airway & Oxygen Therapy: Patient Spontanous Breathing and Patient connected to face mask oxygen  Post-op Assessment: Report given to RN  Post vital signs: Reviewed and stable  Last Vitals:  Filed Vitals:   07/23/15 1355  BP: 144/94  Pulse: 101  Temp: 36.4 C  Resp: 16    Complications: No apparent anesthesia complications

## 2015-07-23 NOTE — Anesthesia Postprocedure Evaluation (Signed)
  Anesthesia Post-op Note  Patient: Calvin [name]  Procedure(s) Performed: Procedure(s): GREEN LIGHT LASER TURP (TRANSURETHRAL RESECTION OF PROSTATE (N/A)  Anesthesia type:General  Patient location: PACU  Post pain: Pain level controlled  Post assessment: Post-op Vital signs reviewed, Patient's Cardiovascular Status Stable, Respiratory Function Stable, Patent Airway and No signs of Nausea or vomiting  Post vital signs: Reviewed and stable  Last Vitals:  Filed Vitals:   07/23/15 1538  BP: 158/89  Pulse:   Temp:   Resp: 16    Level of consciousness: awake, alert  and patient cooperative  Complications: No apparent anesthesia complications

## 2015-07-23 NOTE — Anesthesia Procedure Notes (Signed)
Procedure Name: LMA Insertion Date/Time: 07/23/2015 12:23 PM Performed by: Dionne Bucy Pre-anesthesia Checklist: Patient identified, Patient being monitored, Timeout performed, Emergency Drugs available and Suction available Patient Re-evaluated:Patient Re-evaluated prior to inductionOxygen Delivery Method: Circle system utilized Preoxygenation: Pre-oxygenation with 100% oxygen Intubation Type: IV induction Ventilation: Mask ventilation without difficulty LMA: LMA inserted LMA Size: 5.0 Tube type: Oral Number of attempts: 1 Placement Confirmation: positive ETCO2 and breath sounds checked- equal and bilateral Tube secured with: Tape Dental Injury: Teeth and Oropharynx as per pre-operative assessment

## 2015-07-23 NOTE — Discharge Instructions (Addendum)
Benign Prostatic Hyperplasia An enlarged prostate (benign prostatic hyperplasia) is common in older men. You may experience the following:  Weak urine stream.  Dribbling.  Feeling like the bladder has not emptied completely.  Difficulty starting urination.  Getting up frequently at night to urinate.  Urinating more frequently during the day. HOME CARE INSTRUCTIONS  Monitor your prostatic hyperplasia for any changes. The following actions may help to alleviate any discomfort you are experiencing:  Give yourself time when you urinate.  Stay away from alcohol.  Avoid beverages containing caffeine, such as coffee, tea, and colas, because they can make the problem worse.  Avoid decongestants, antihistamines, and some prescription medicines that can make the problem worse.  Follow up with your health care provider for further treatment as recommended. SEEK MEDICAL CARE IF:  You are experiencing progressive difficulty voiding.  Your urine stream is progressively getting narrower.  You are awaking from sleep with the urge to void more frequently.  You are constantly feeling the need to void.  You experience loss of urine, especially in small amounts. SEEK IMMEDIATE MEDICAL CARE IF:   You develop increased pain with urination or are unable to urinate.  You develop severe abdominal pain, vomiting, a high fever, or fainting.  You develop back pain or blood in your urine. MAKE SURE YOU:   Understand these instructions.  Will watch your condition.  Will get help right away if you are not doing well or get worse.   This information is not intended to replace advice given to you by your health care provider. Make sure you discuss any questions you have with your health care provider.   Document Released: 08/24/2005 Document Revised: 09/14/2014 Document Reviewed: 01/24/2013 Elsevier Interactive Patient Education 2016 Elsevier [employer]  Benign Prostatic Hyperplasia An enlarged  prostate (benign prostatic hyperplasia) is common in older men. You may experience the following:  Weak urine stream.  Dribbling.  Feeling like the bladder has not emptied completely.  Difficulty starting urination.  Getting up frequently at night to urinate.  Urinating more frequently during the day. HOME CARE INSTRUCTIONS  Monitor your prostatic hyperplasia for any changes. The following actions may help to alleviate any discomfort you are experiencing:  Give yourself time when you urinate.  Stay away from alcohol.  Avoid beverages containing caffeine, such as coffee, tea, and colas, because they can make the problem worse.  Avoid decongestants, antihistamines, and some prescription medicines that can make the problem worse.  Follow up with your health care provider for further treatment as recommended. SEEK MEDICAL CARE IF:  You are experiencing progressive difficulty voiding.  Your urine stream is progressively getting narrower.  You are awaking from sleep with the urge to void more frequently.  You are constantly feeling the need to void.  You experience loss of urine, especially in small amounts. SEEK IMMEDIATE MEDICAL CARE IF:   You develop increased pain with urination or are unable to urinate.  You develop severe abdominal pain, vomiting, a high fever, or fainting.  You develop back pain or blood in your urine. MAKE SURE YOU:   Understand these instructions.  Will watch your condition.  Will get help right away if you are not doing well or get worse.   This information is not intended to replace advice given to you by your health care provider. Make sure you discuss any questions you have with your health care provider.   Document Released: 08/24/2005 Document Revised: 09/14/2014 Document Reviewed: 01/24/2013 Elsevier Interactive Patient  Education 2016 [place] Laser Prostate Treatment Green light laser therapy is a procedure that  uses a special high-energy laser for vaporizing extra prostate tissue. It is less invasive than traditional methods of prostate surgery, which involve cutting out the prostate tissue. Because the tissue is vaporized rather than cut out there is generally less blood loss. LET [facility] CARE PROVIDER KNOW ABOUT:  Any allergies you have.  Any medicines you are taking, including vitamins, herbs, eye drops, creams, and over-the-counter medication.  Previous problems you or members of your family have had with the use of anesthetics.  Any blood disorders you have.  Previous surgeries you have had.  Medical conditions you have. RISKS AND COMPLICATIONS Generally, green light laser prostate treatment is a safe procedure. However, as with any procedure, complications can occur. Possible complications include:  Urinary tract infection.  Erectile dysfunction (rare).  Dry ejaculation--Semen is not released when you reach sexual climax.  Scar tissue in the urinary passage. BEFORE THE PROCEDURE   Your health care provider may discuss medicines you are taking and may advise you to stop taking specific ones.  You may be given antibiotic medicine to take as a precaution against bacterial infection.  Do not eat or drink anything for 8 hours before your procedure or as directed by your health care provider. You may have a sip of water to take any necessary medicines. PROCEDURE Depending on the size and shape of your prostate, the procedure may take 30-60 minutes. You will be given one of the following:   A medicine that makes you go to sleep (general anesthetic).  A medicine injected into your spine that numbs your body below the waist (spinal anesthetic). Sedation is usually given with spinal anesthetic so you will be relaxed. A tube containing viewing scopes and instruments will be inserted through your penis so that no cuts (incisions) are needed. A thin fiber is put through the tube and  positioned next to the excess prostate tissue. Pulses of laser light come from the end of the fiber and are projected onto the excess tissue. The laser beam is absorbed by your blood, which becomes hot enough to vaporize the excess prostate tissue. This laser beam will seal off the blood vessels, decreasing bleeding. The tube with the viewing scopes, instruments, and thin fiber will be removed and replaced with a temporary catheter. AFTER THE PROCEDURE  After the surgery, you will be sent to the recovery room for a short time. Depending on factors such as the amount of prostate tissue vaporized, the strength of your bladder, and the amount of bleeding expected, the catheter may be removed. Generally, overnight stay is not needed and you will be sent home on the same day as the procedure. You may be sent home with elastic support stockings to help prevent blood clots in your legs.    This information is not intended to replace advice given to you by your health care provider. Make sure you discuss any questions you have with your health care provider.   Document Released: 12/01/2007 Document Revised: 08/29/2013 Document Reviewed: 02/13/2013 Elsevier Interactive Patient Education 2016 [place] [facility] Prostate laser surgery is a procedure to eliminate prostate tissue. There are two types of prostate laser surgery: ablation (prostate tissue is melted away) and enucleation (prostate tissue is cut out). LET [facility] CARE PROVIDER KNOW ABOUT:  Any allergies you have.  All medicines you are taking, including vitamins, herbs, eye drops,  creams, and over-the-counter medicines.  Previous problems you or members of your family have had with the use of anesthetics.  Any blood disorders you have.  Previous surgeries you have had.  Medical conditions you have. RISKS AND COMPLICATIONS  Generally prostate laser surgery is a safe procedure. However, as with any procedure, problems  can occur. Possible problems include:  Bleeding and the need for a blood transfusion.   Urinary tract infection.  Erectile dysfunction.  Narrowing (scar or stricture) of the urethra, which blocks the flow of urine.  Dry ejaculation (semen is not released when you reach sexual climax). BEFORE THE PROCEDURE   If you are on blood thinners, such as warfarin or clopidogrel, or nonprescription pain-relieving medicines, such as naproxen sodium or ibuprofen, you may be asked to stop taking them before the procedure.  Your health care provider may ask you to start taking antibiotic medicines before the procedure as a precaution against a bacterial infection. The procedure will not be performed if your urine is infected.  You should have nothing to eat or drink for at least 8 hours before your procedure, or as suggested by your health care provider. You may have a sip of water to take medications not stopped for the procedure. PROCEDURE  You will be given one of the following:   A medicine that numbs the area (local anesthetic).  A medicine injected into your spine that numbs your body below the waist (spinal anesthetic). A sedative is usually given with spinal anesthetic so you will be relaxed during the procedure. A viewing scope and instruments will be placed in a tube that is inserted through your penis, so no incisions will be needed to insert the scope and instruments. Depending on the type of laser used, the prostate tissue will either be vaporized or cut away. The laser beam will coagulate any small bleeding areas. At the end of the surgery, a special tube will be inserted into your bladder to drain the urine from your bladder (urinary catheter). AFTER THE PROCEDURE You will be sent to the recovery room for a short time. In the recovery room, you will receive fluids through an IV tube inserted in one of your veins. Your blood pressure and pulse will be checked frequently to make sure that  they stabilize. Once you are eating and drinking fluids appropriately, the IV tube will be removed.  Depending on your specific needs, you may be admitted to the hospital or you will be sent home after the procedure. If you are sent home:  You may be sent home with elastic support stockings to help prevent blood clots in your legs.  You will also probably be given an antibiotic medicine.  Unless told otherwise, you may restart your other medications.  You may be given a stool softener.   This information is not intended to replace advice given to you by your health care provider. Make sure you discuss any questions you have with your health care provider.   Document Released: 08/24/2005 Document Revised: 08/29/2013 Document Reviewed: 02/13/2013 Elsevier Interactive Patient Education [employer].

## 2015-07-23 NOTE — OR Nursing (Signed)
Instructions given on irrigation and empting of foley catheter. leg bag given to pt.

## 2015-07-23 NOTE — H&P (Signed)
Date of Initial H&P: 07/15/15  History reviewed, patient examined, no change in status, stable for surgery.

## 2015-07-23 NOTE — Anesthesia Preprocedure Evaluation (Addendum)
Anesthesia Evaluation  Patient identified by MRN, date of birth, ID band Patient awake    Reviewed: Allergy & Precautions, NPO status , Patient's Chart, lab work & pertinent test results, reviewed documented beta blocker date and time   Airway Mallampati: II  TM Distance: >3 FB     Dental  (+) Chipped, Upper Dentures   Pulmonary asthma , COPD,  COPD inhaler, former smoker,           Cardiovascular + dysrhythmias      Neuro/Psych    GI/Hepatic   Endo/Other    Renal/GU      Musculoskeletal  (+) Arthritis ,   Abdominal   Peds  Hematology   Anesthesia Other Findings Cardiac skip for many years.  Reproductive/Obstetrics                            Anesthesia Physical Anesthesia Plan  ASA: III  Anesthesia Plan: General   Post-op Pain Management:    Induction: Intravenous  Airway Management Planned: LMA  Additional Equipment:   Intra-op Plan:   Post-operative Plan:   Informed Consent: I have reviewed the patients History and Physical, chart, labs and discussed the procedure including the risks, benefits and alternatives for the proposed anesthesia with the patient or authorized representative who has indicated his/her understanding and acceptance.     Plan Discussed with: CRNA  Anesthesia Plan Comments:         Anesthesia Quick Evaluation

## 2015-07-23 NOTE — Op Note (Signed)
Preoperative diagnosis: 1. BPH with bladder outlet obstruction                                            2. Prostate cancer Postoperative diagnosis: Same  Procedure: Photovaporization of the prostate with greenlight laser   Surgeon: Otelia Limes. Yves Dill MD, FACS Anesthesia: Gen.  Indications:See the history and physical. After informed consent the above procedure(s) were requested     Technique and findings: After adequate general anesthesia had been obtained patient was placed into dorsal lithotomy position and the perineum was prepped and draped in the usual fashion. Urethra was sequentially dilated from 18 [place] up to 28 [place] with the [employer]. At this point the laser scope was coupled to the camera and visually advanced into the bladder. Patient had trilobar BPH with visual obstruction. The greenlight XPS laser fiber was introduced through the scope and set at 72 W. Median lobe and bladder neck tissue was vaporized. Remaining obstructive tissue from the bladder neck to the verumontanum was then vaporized. At this point the scope was removed and 10 cc of viscous Xylocaine instilled within the urethra and the bladder. A 20 French silicone catheter was placed. The catheter was irrigated until clear. A B&O suppository was placed. The procedure was then terminated and the patient was transferred to the recovery room in stable condition.

## 2015-07-24 ENCOUNTER — Encounter: Payer: Self-pay | Admitting: Urology

## 2017-10-21 DIAGNOSIS — E119 Type 2 diabetes mellitus without complications: Secondary | ICD-10-CM | POA: Insufficient documentation

## 2018-11-07 DIAGNOSIS — R0602 Shortness of breath: Secondary | ICD-10-CM | POA: Insufficient documentation

## 2018-11-22 DIAGNOSIS — R0902 Hypoxemia: Secondary | ICD-10-CM | POA: Insufficient documentation

## 2018-12-01 ENCOUNTER — Observation Stay
Admission: RE | Admit: 2018-12-01 | Discharge: 2018-12-02 | Disposition: A | Discharge status: Home / Self Care | Payer: Medicare Other | Attending: Cardiology | Admitting: Cardiology

## 2018-12-01 ENCOUNTER — Encounter
Admission: RE | Disposition: A | Discharge status: Home / Self Care | Payer: Self-pay | Source: Home / Self Care | Attending: Cardiology

## 2018-12-01 ENCOUNTER — Other Ambulatory Visit: Payer: Self-pay

## 2018-12-01 DIAGNOSIS — E119 Type 2 diabetes mellitus without complications: Secondary | ICD-10-CM | POA: Diagnosis not present

## 2018-12-01 DIAGNOSIS — I10 Essential (primary) hypertension: Secondary | ICD-10-CM | POA: Diagnosis not present

## 2018-12-01 DIAGNOSIS — I420 Dilated cardiomyopathy: Secondary | ICD-10-CM | POA: Insufficient documentation

## 2018-12-01 DIAGNOSIS — I2510 Atherosclerotic heart disease of native coronary artery without angina pectoris: Principal | ICD-10-CM | POA: Diagnosis present

## 2018-12-01 DIAGNOSIS — E7800 Pure hypercholesterolemia, unspecified: Secondary | ICD-10-CM | POA: Insufficient documentation

## 2018-12-01 DIAGNOSIS — Z79899 Other long term (current) drug therapy: Secondary | ICD-10-CM | POA: Diagnosis not present

## 2018-12-01 DIAGNOSIS — Z886 Allergy status to analgesic agent status: Secondary | ICD-10-CM | POA: Diagnosis not present

## 2018-12-01 DIAGNOSIS — R9430 Abnormal result of cardiovascular function study, unspecified: Secondary | ICD-10-CM

## 2018-12-01 DIAGNOSIS — Z955 Presence of coronary angioplasty implant and graft: Secondary | ICD-10-CM | POA: Insufficient documentation

## 2018-12-01 DIAGNOSIS — Z87891 Personal history of nicotine dependence: Secondary | ICD-10-CM | POA: Insufficient documentation

## 2018-12-01 DIAGNOSIS — R000 Tachycardia, unspecified: Secondary | ICD-10-CM | POA: Diagnosis not present

## 2018-12-01 DIAGNOSIS — I272 Pulmonary hypertension, unspecified: Secondary | ICD-10-CM | POA: Diagnosis not present

## 2018-12-01 HISTORY — PX: CORONARY STENT INTERVENTION: CATH118234

## 2018-12-01 HISTORY — PX: RIGHT/LEFT HEART CATH AND CORONARY ANGIOGRAPHY: CATH118266

## 2018-12-01 LAB — POCT ACTIVATED CLOTTING TIME: Activated Clotting Time: 362 seconds

## 2018-12-01 SURGERY — RIGHT/LEFT HEART CATH AND CORONARY ANGIOGRAPHY
Anesthesia: Moderate Sedation | Laterality: Right

## 2018-12-01 MED ORDER — SODIUM CHLORIDE 0.9 % IV SOLN: 250.0000 mL | INTRAVENOUS | Status: DC | PRN

## 2018-12-01 MED ORDER — IOHEXOL 300 MG/ML  SOLN
INTRAMUSCULAR | Status: DC | PRN
  Administered 2018-12-01: 30 mL via INTRA_ARTERIAL

## 2018-12-01 MED ORDER — SODIUM CHLORIDE 0.9 % IV SOLN
INTRAVENOUS | Status: AC | PRN
  Administered 2018-12-01 (×2): 1.75 mg/kg/h via INTRAVENOUS

## 2018-12-01 MED ORDER — IPRATROPIUM-ALBUTEROL 0.5-2.5 (3) MG/3ML IN SOLN
3.0000 mL | Freq: Two times a day (BID) | RESPIRATORY_TRACT | Status: DC
  Administered 2018-12-02: 3 mL via RESPIRATORY_TRACT
  Filled 2018-12-01: qty 3

## 2018-12-01 MED ORDER — DICLOFENAC SODIUM 25 MG PO TBEC
50.0000 mg | DELAYED_RELEASE_TABLET | Freq: Two times a day (BID) | ORAL | Status: DC
  Administered 2018-12-01 – 2018-12-02 (×2): 50 mg via ORAL
  Filled 2018-12-01 (×4): qty 2

## 2018-12-01 MED ORDER — CLOPIDOGREL BISULFATE 75 MG PO TABS
ORAL_TABLET | ORAL | Status: AC
  Filled 2018-12-01: qty 8

## 2018-12-01 MED ORDER — SODIUM CHLORIDE 0.9% FLUSH: 3.0000 mL | INTRAVENOUS | Status: DC | PRN

## 2018-12-01 MED ORDER — SODIUM CHLORIDE 0.9 % IV SOLN
0.2500 mg/kg/h | INTRAVENOUS | Status: DC
  Filled 2018-12-01: qty 250

## 2018-12-01 MED ORDER — PANTOPRAZOLE SODIUM 40 MG PO TBEC
40.0000 mg | DELAYED_RELEASE_TABLET | Freq: Two times a day (BID) | ORAL | Status: DC
  Administered 2018-12-01 – 2018-12-02 (×2): 40 mg via ORAL
  Filled 2018-12-01 (×3): qty 1

## 2018-12-01 MED ORDER — MIDAZOLAM HCL 2 MG/2ML IJ SOLN
INTRAMUSCULAR | Status: DC | PRN
  Administered 2018-12-01: 0.5 mg via INTRAVENOUS

## 2018-12-01 MED ORDER — BIVALIRUDIN TRIFLUOROACETATE 250 MG IV SOLR
INTRAVENOUS | Status: AC
  Filled 2018-12-01: qty 250

## 2018-12-01 MED ORDER — SODIUM CHLORIDE 0.9 % WEIGHT BASED INFUSION
3.0000 mL/kg/h | INTRAVENOUS | Status: AC
  Administered 2018-12-01: 3 mL/kg/h via INTRAVENOUS

## 2018-12-01 MED ORDER — SODIUM CHLORIDE 0.9% FLUSH
3.0000 mL | Freq: Two times a day (BID) | INTRAVENOUS | Status: DC
  Administered 2018-12-01 – 2018-12-02 (×2): 3 mL via INTRAVENOUS

## 2018-12-01 MED ORDER — IOPAMIDOL (ISOVUE-300) INJECTION 61%
INTRAVENOUS | Status: DC | PRN
  Administered 2018-12-01: 200 mL via INTRA_ARTERIAL

## 2018-12-01 MED ORDER — CLOPIDOGREL BISULFATE 75 MG PO TABS
ORAL_TABLET | ORAL | Status: DC | PRN
  Administered 2018-12-01: 600 mg

## 2018-12-01 MED ORDER — HYDROCODONE-ACETAMINOPHEN 5-325 MG PO TABS: 1.0000 | ORAL_TABLET | Freq: Four times a day (QID) | ORAL | Status: DC | PRN

## 2018-12-01 MED ORDER — HEPARIN (PORCINE) IN NACL 1000-0.9 UT/500ML-% IV SOLN
INTRAVENOUS | Status: AC
  Filled 2018-12-01: qty 1000

## 2018-12-01 MED ORDER — MONTELUKAST SODIUM 10 MG PO TABS
10.0000 mg | ORAL_TABLET | Freq: Every day | ORAL | Status: DC
  Administered 2018-12-01: 10 mg via ORAL
  Filled 2018-12-01: qty 1

## 2018-12-01 MED ORDER — SODIUM CHLORIDE 0.9 % WEIGHT BASED INFUSION: 1.0000 mL/kg/h | INTRAVENOUS | Status: AC

## 2018-12-01 MED ORDER — ALBUTEROL SULFATE (2.5 MG/3ML) 0.083% IN NEBU: 2.5000 mg | INHALATION_SOLUTION | Freq: Four times a day (QID) | RESPIRATORY_TRACT | Status: DC | PRN

## 2018-12-01 MED ORDER — BACLOFEN 10 MG PO TABS
10.0000 mg | ORAL_TABLET | Freq: Three times a day (TID) | ORAL | Status: DC
  Administered 2018-12-01 – 2018-12-02 (×3): 10 mg via ORAL
  Filled 2018-12-01 (×6): qty 1

## 2018-12-01 MED ORDER — LABETALOL HCL 5 MG/ML IV SOLN: 10.0000 mg | INTRAVENOUS | Status: AC | PRN

## 2018-12-01 MED ORDER — IPRATROPIUM-ALBUTEROL 0.5-2.5 (3) MG/3ML IN SOLN
3.0000 mL | Freq: Four times a day (QID) | RESPIRATORY_TRACT | Status: DC
  Administered 2018-12-01: 3 mL via RESPIRATORY_TRACT
  Filled 2018-12-01: qty 3

## 2018-12-01 MED ORDER — TAMSULOSIN HCL 0.4 MG PO CAPS
0.4000 mg | ORAL_CAPSULE | Freq: Three times a day (TID) | ORAL | Status: DC
  Administered 2018-12-01 – 2018-12-02 (×3): 0.4 mg via ORAL
  Filled 2018-12-01 (×4): qty 1

## 2018-12-01 MED ORDER — FENTANYL CITRATE (PF) 100 MCG/2ML IJ SOLN
INTRAMUSCULAR | Status: DC | PRN
  Administered 2018-12-01: 12.5 ug via INTRAVENOUS

## 2018-12-01 MED ORDER — METOPROLOL SUCCINATE ER 50 MG PO TB24
25.0000 mg | ORAL_TABLET | Freq: Every day | ORAL | Status: DC
  Administered 2018-12-01 – 2018-12-02 (×2): 25 mg via ORAL
  Filled 2018-12-01 (×2): qty 1

## 2018-12-01 MED ORDER — SODIUM CHLORIDE 0.9 % WEIGHT BASED INFUSION: 1.0000 mL/kg/h | INTRAVENOUS | Status: DC

## 2018-12-01 MED ORDER — FENTANYL CITRATE (PF) 100 MCG/2ML IJ SOLN
INTRAMUSCULAR | Status: AC
  Filled 2018-12-01: qty 2

## 2018-12-01 MED ORDER — FESOTERODINE FUMARATE ER 8 MG PO TB24
8.0000 mg | ORAL_TABLET | Freq: Every day | ORAL | Status: DC
  Administered 2018-12-02: 8 mg via ORAL
  Filled 2018-12-01 (×2): qty 1

## 2018-12-01 MED ORDER — ASPIRIN 81 MG PO CHEW: 81.0000 mg | CHEWABLE_TABLET | ORAL | Status: DC

## 2018-12-01 MED ORDER — TIOTROPIUM BROMIDE MONOHYDRATE 18 MCG IN CAPS
18.0000 ug | ORAL_CAPSULE | Freq: Every day | RESPIRATORY_TRACT | Status: DC
  Administered 2018-12-02: 18 ug via RESPIRATORY_TRACT
  Filled 2018-12-01: qty 5

## 2018-12-01 MED ORDER — CLOPIDOGREL BISULFATE 75 MG PO TABS
75.0000 mg | ORAL_TABLET | Freq: Every day | ORAL | Status: DC
  Administered 2018-12-02: 75 mg via ORAL
  Filled 2018-12-01: qty 1

## 2018-12-01 MED ORDER — GABAPENTIN 300 MG PO CAPS
300.0000 mg | ORAL_CAPSULE | Freq: Three times a day (TID) | ORAL | Status: DC
  Administered 2018-12-01 – 2018-12-02 (×3): 300 mg via ORAL
  Filled 2018-12-01 (×3): qty 1

## 2018-12-01 MED ORDER — ACETAMINOPHEN 325 MG PO TABS: 650.0000 mg | ORAL_TABLET | ORAL | Status: DC | PRN

## 2018-12-01 MED ORDER — SODIUM CHLORIDE 0.9% FLUSH: 3.0000 mL | Freq: Two times a day (BID) | INTRAVENOUS | Status: DC

## 2018-12-01 MED ORDER — BIVALIRUDIN BOLUS VIA INFUSION - CUPID
INTRAVENOUS | Status: DC | PRN
  Administered 2018-12-01: 59.55 mg via INTRAVENOUS

## 2018-12-01 MED ORDER — ONDANSETRON HCL 4 MG/2ML IJ SOLN: 4.0000 mg | Freq: Four times a day (QID) | INTRAMUSCULAR | Status: DC | PRN

## 2018-12-01 MED ORDER — MIDAZOLAM HCL 2 MG/2ML IJ SOLN
INTRAMUSCULAR | Status: AC
  Filled 2018-12-01: qty 2

## 2018-12-01 MED ORDER — HEPARIN (PORCINE) IN NACL 1000-0.9 UT/500ML-% IV SOLN
INTRAVENOUS | Status: DC | PRN
  Administered 2018-12-01 (×2): 500 mL

## 2018-12-01 MED ORDER — HYDRALAZINE HCL 20 MG/ML IJ SOLN: 10.0000 mg | INTRAMUSCULAR | Status: AC | PRN

## 2018-12-01 SURGICAL SUPPLY — 26 items
BALLN TREK RX 2.5X12 (BALLOONS) ×4
BALLN ~~LOC~~ TREK RX 3.0X12 (BALLOONS) ×4
BALLN ~~LOC~~ TREK RX 3.5X15 (BALLOONS) ×4
BALLN ~~LOC~~ TREK RX 3.75X8 (BALLOONS) ×4
BALLOON TREK RX 2.5X12 (BALLOONS) ×2 IMPLANT
BALLOON ~~LOC~~ TREK RX 3.0X12 (BALLOONS) ×2 IMPLANT
BALLOON ~~LOC~~ TREK RX 3.5X15 (BALLOONS) ×2 IMPLANT
BALLOON ~~LOC~~ TREK RX 3.75X8 (BALLOONS) ×2 IMPLANT
CATH INFINITI 5FR ANG PIGTAIL (CATHETERS) ×4 IMPLANT
CATH INFINITI 5FR JL4 (CATHETERS) ×4 IMPLANT
CATH INFINITI JR4 5F (CATHETERS) ×4 IMPLANT
CATH SWANZ 7F THERMO (CATHETERS) ×4 IMPLANT
CATH VISTA GUIDE 6FR JR4 (CATHETERS) ×4 IMPLANT
DEVICE CLOSURE MYNXGRIP 6/7F (Vascular Products) ×8 IMPLANT
DEVICE INFLAT 30 PLUS (MISCELLANEOUS) ×4 IMPLANT
GUIDEWIRE EMER 3M J .025X150CM (WIRE) ×4 IMPLANT
KIT MANI 3VAL PERCEP (MISCELLANEOUS) ×4 IMPLANT
KIT RIGHT HEART (MISCELLANEOUS) ×8 IMPLANT
NEEDLE PERC 18GX7CM (NEEDLE) ×4 IMPLANT
PACK CARDIAC CATH (CUSTOM PROCEDURE TRAY) ×4 IMPLANT
SHEATH AVANTI 5FR X 11CM (SHEATH) ×4 IMPLANT
SHEATH AVANTI 6FR X 11CM (SHEATH) ×4 IMPLANT
SHEATH AVANTI 7FRX11 (SHEATH) ×4 IMPLANT
STENT RESOLUTE ONYX 3.0X15 (Permanent Stent) ×4 IMPLANT
WIRE ASAHI PROWATER 180CM (WIRE) ×4 IMPLANT
WIRE GUIDERIGHT .035X150 (WIRE) ×8 IMPLANT

## 2018-12-02 ENCOUNTER — Encounter: Payer: Self-pay | Admitting: Cardiology

## 2018-12-02 DIAGNOSIS — I2510 Atherosclerotic heart disease of native coronary artery without angina pectoris: Secondary | ICD-10-CM | POA: Diagnosis not present

## 2018-12-02 LAB — CBC
HCT: 41 % (ref 39.0–52.0)
Hemoglobin: 12.8 g/dL — ABNORMAL LOW (ref 13.0–17.0)
MCH: 26.2 pg (ref 26.0–34.0)
MCHC: 31.2 g/dL (ref 30.0–36.0)
MCV: 83.8 fL (ref 80.0–100.0)
Platelets: 303 10*3/uL (ref 150–400)
RBC: 4.89 MIL/uL (ref 4.22–5.81)
RDW: 15.9 % — ABNORMAL HIGH (ref 11.5–15.5)
WBC: 10.3 10*3/uL (ref 4.0–10.5)
nRBC: 0 % (ref 0.0–0.2)

## 2018-12-02 LAB — BASIC METABOLIC PANEL
Anion gap: 7 (ref 5–15)
BUN: 25 mg/dL — ABNORMAL HIGH (ref 8–23)
CO2: 19 mmol/L — ABNORMAL LOW (ref 22–32)
Calcium: 8.3 mg/dL — ABNORMAL LOW (ref 8.9–10.3)
Chloride: 115 mmol/L — ABNORMAL HIGH (ref 98–111)
Creatinine, Ser: 1.43 mg/dL — ABNORMAL HIGH (ref 0.61–1.24)
GFR calc Af Amer: 55 mL/min — ABNORMAL LOW (ref >60)
GFR calc non Af Amer: 48 mL/min — ABNORMAL LOW (ref >60)
GLUCOSE: 119 mg/dL — AB (ref 70–99)
Potassium: 4.3 mmol/L (ref 3.5–5.1)
Sodium: 141 mmol/L (ref 135–145)

## 2018-12-02 MED ORDER — CLOPIDOGREL BISULFATE 75 MG PO TABS: 75.0000 mg | ORAL_TABLET | Freq: Every day | ORAL | 11 refills | Status: AC

## 2018-12-02 NOTE — Progress Notes (Addendum)
Cardiovascular and Pulmonary Nurse Navigator Note:    73 year old male with PMHx of pure hypercholesterolemia, SOB, essential HTN, Tachycardia, DM, Hypoxia, dilated cardiomyopathy.  Patient was recently seen by Dr. Saralyn Pilar due tot 11-month hx of progressive exertional dyspnea, dilated CM with LVEF of 35 - 40%, severe tricuspid regurgitation with evidence of Pulmonary HTN on 2D echo, with hypoxia on RA.  Patient presented to [facility] for outpatient right/left cardiac cath on 12/01/2018.     Calvin [name]  CARDIAC CATHETERIZATION  Order# [id number]  Reading physician: Isaias Cowman, MD Ordering physician: Isaias Cowman, MD Study date: 12/01/18  Physicians   Panel Physicians Referring Physician Case Authorizing Physician  Paraschos, Alexander, MD (Primary)    Procedures   CORONARY STENT INTERVENTION  RIGHT/LEFT HEART CATH AND CORONARY ANGIOGRAPHY  Conclusion     Ost LM lesion is 30% stenosed.  Ost LAD lesion is 30% stenosed.  Prox RCA lesion is 95% stenosed.  A drug-eluting stent was successfully placed using a STENT RESOLUTE ONYX 3.0X15.  Post intervention, there is a 10% residual stenosis.  Hemodynamic findings consistent with moderate pulmonary hypertension.   1.  One-vessel coronary artery disease with 95% stenosis proximal RCA 2.  Moderate reduced left ventricular function with LVEF 35% 3.  Moderate pulmonary hypertension 4.  Successful PCI with DES proximal RCA   Rounded on patient.  Patient on side of bed talking on the phone when this RN entered the room.    "Angioplasty and Stents"  booklet given and reviewed with patient. Discussed the definition of CAD. Reviewed the location of CAD and where his stent was placed. Informed patient he will be given a stent card. Explained the purpose of the stent card. Instructed patient to keep stent card in his wallet.  ? Discussed modifiable risk factors including controlling blood pressure, cholesterol, and blood  sugar; following heart healthy diet; maintaining healthy weight; exercise; and smoking cessation, if applicable.   ? Discussed cardiac medications including rationale for taking, mechanisms of action, and side effects. Stressed the importance of taking medications as prescribed. Patient is allergic to Aspirin.  Patient is not currently on a cholesterol lowering agent.  Upon asking patient about this, his reply, "I used to take a medication years ago for my cholesterol.  I am not currently taking anything for cholesterol."  Patient has pure hypercholesterolemia.   ? Discussed emergency plan for heart attack symptoms. Patient verbalized understanding of need to call 911 and not to drive himself to ER if having cardiac symptoms / chest pain.  ? Heart healthy diet of low sodium, low fat, low cholesterol diet discussed. Information provided.   ? Smoking Cessation - Patient is a FORMER smoker.   ? Exercise - Patient does not currently exercise.  His activities since November have been limited due to SOB.   Benefits of exercised discussed. Informed patient that his cardiologist has referred him to outpatient Cardiac Rehab. An overview of the program was provided. Brochure, informational letter, class and orientation times, and CPT billing codes given to patient. Patient is planning to talk to his [place] (Pulmonologist) about referring him to Cardiac Rehab.  Note:  If [facility], then the cost of the program will be 100% covered by the [place].  Explained to patient the Cardiac Rehab dept is currently closed given the COVID-19 pandemic.  The Cardiac Rehab dept to contact patient upon reopening of the department.    Patient for discharge today.   ?  Patient appreciative of the information.  ? Roanna Epley, RN, BSN, [place]  [facility] Cardiac & Pulmonary Rehab  Cardiovascular & Pulmonary Nurse Navigator  Direct Line: [phone]  Department Phone #: [phone] Fax:  [phone]  Email Address: [email or web address]

## 2018-12-02 NOTE — Discharge Summary (Signed)
Physician Discharge Summary  Patient ID: Calvin [name] MRN: [id number] DOB/AGE: 75-03-1946 75 y.o.  Admit date: 12/01/2018 Discharge date: 12/02/2018  Primary Discharge Diagnosis coronary artery disease Secondary Discharge Diagnosis moderate pulmonary hypertension  Significant Diagnostic Studies: yes  Consults: None  Hospital Course: The patient underwent elective right and left heart cardiac catheterization/26/2020.  Right heart catheterization revealed mean pulmonary arterial pressure 38 mmHg consistent with moderate pulmonary hypertension.  Left ventriculography revealed mild reduced left ventricular function, with LVEF 35%.  Coronary angiography revealed one-vessel coronary artery disease with high-grade 95% stenosis proximal RCA.  The patient underwent successful PCI with 3.0 x 15 mm resolute Onyx drug-eluting stent in the proximal RCA with an excellent angiographic result.  Patient had an uncomplicated hospital stay.  On the morning of 12/02/2018 the patient ambulated without chest pain and was discharged home in stable condition.   Discharge Exam: Blood pressure 106/71, pulse (!) 105, temperature 98.1 F (36.7 C), temperature source Oral, resp. rate 19, height 5\' 7"  (1.702 m), weight 81.7 kg, SpO2 92 %.   General appearance: alert Head: Normocephalic, without obvious abnormality, atraumatic Eyes: conjunctivae/corneas clear. PERRL, EOM's intact. Fundi benign. Ears: normal TM's and external ear canals both ears Nose: Nares normal. Septum midline. Mucosa normal. No drainage or sinus tenderness. Throat: lips, mucosa, and tongue normal; teeth and gums normal Neck: no adenopathy, no carotid bruit, no JVD, supple, symmetrical, trachea midline and thyroid not enlarged, symmetric, no tenderness/mass/nodules Back: symmetric, no curvature. ROM normal. No CVA tenderness. Resp: Decreased breath sounds bilaterally Chest wall: no tenderness Cardio: regular rate and rhythm, S1, S2 normal, no  murmur, click, rub or gallop GI: soft, non-tender; bowel sounds normal; no masses,  no organomegaly Extremities: extremities normal, atraumatic, no cyanosis or edema Pulses: 2+ and symmetric Skin: Skin color, texture, turgor normal. No rashes or lesions Lymph nodes: Cervical, supraclavicular, and axillary nodes normal. Neurologic: Grossly normal Labs:   Lab Results  Component Value Date   WBC 10.3 12/02/2018   HGB 12.8 (L) 12/02/2018   HCT 41.0 12/02/2018   MCV 83.8 12/02/2018   PLT 303 12/02/2018    Recent Labs  Lab 12/02/18 0533  NA 141  K 4.3  CL 115*  CO2 19*  BUN 25*  CREATININE 1.43*  CALCIUM 8.3*  GLUCOSE 119*      Radiology:  EKG: Sinus tachycardia with right bundle branch block  FOLLOW UP PLANS AND APPOINTMENTS Discharge Instructions    AMB Referral to Cardiac Rehabilitation - Phase II   Complete by:  As directed    Diagnosis:  Coronary Stents     Allergies as of 12/02/2018      Reactions   Aspirin Hives, Swelling      Medication List    TAKE these medications   albuterol 108 (90 Base) MCG/ACT inhaler Commonly known as:  PROVENTIL HFA;VENTOLIN HFA Inhale 2 puffs into the lungs every 4 (four) hours as needed for wheezing or shortness of breath.   Asmanex (30 Metered Doses) 220 MCG/INH inhaler Generic drug:  mometasone Inhale 2 puffs into the lungs at bedtime.   Chlor-Trimeton 4 MG tablet Generic drug:  chlorpheniramine Take 4 mg by mouth 2 (two) times daily.   clopidogrel 75 MG tablet Commonly known as:  PLAVIX Take 1 tablet (75 mg total) by mouth daily.   gabapentin 300 MG capsule Commonly known as:  NEURONTIN Take 600 mg by mouth 3 (three) times daily.   metoprolol succinate 25 MG 24 hr tablet Commonly known as:  TOPROL-XL Take 25 mg by mouth daily.   pantoprazole 40 MG tablet Commonly known as:  PROTONIX Take 1 tablet (40 mg total) by mouth 2 (two) times daily.   Singulair 10 MG tablet Generic drug:  montelukast Take 10 mg by  mouth at bedtime.   Stiolto Respimat 2.5-2.5 MCG/ACT Aers Generic drug:  Tiotropium Bromide-Olodaterol Inhale 2 puffs into the lungs daily.      Follow-up Information    Braxton Vantrease, MD Follow up in 1 week(s).   Specialty:  Cardiology Contact information: [facility] [place] [address] [phone]           BRING ALL MEDICATIONS WITH YOU TO FOLLOW UP APPOINTMENTS  Time spent with patient to include physician time: 25 minutes Signed:  Isaias Cowman MD, PhD, [facility] 12/02/2018, 8:30 AM

## 2018-12-02 NOTE — Discharge Planning (Signed)
All d/c paperwork and instructions have been performed and given to patient per hospital protocol

## 2018-12-02 NOTE — Care Management Obs Status (Signed)
[facility] NOTIFICATION   Patient Details  Name: Calvin [name] MRN: [id number] Date of Birth: Mar 18, 1946   Medicare Observation Status Notification Given:  Yes    Elza Rafter, RN 12/02/2018, 9:55 AM

## 2019-01-09 DIAGNOSIS — Z955 Presence of coronary angioplasty implant and graft: Secondary | ICD-10-CM | POA: Insufficient documentation

## 2019-01-09 DIAGNOSIS — I272 Pulmonary hypertension, unspecified: Secondary | ICD-10-CM | POA: Insufficient documentation

## 2019-02-23 DIAGNOSIS — I5042 Chronic combined systolic (congestive) and diastolic (congestive) heart failure: Secondary | ICD-10-CM | POA: Insufficient documentation

## 2019-09-14 ENCOUNTER — Other Ambulatory Visit (INDEPENDENT_AMBULATORY_CARE_PROVIDER_SITE_OTHER): Payer: Self-pay | Admitting: Vascular Surgery

## 2019-09-14 DIAGNOSIS — L819 Disorder of pigmentation, unspecified: Secondary | ICD-10-CM

## 2019-09-14 DIAGNOSIS — R0989 Other specified symptoms and signs involving the circulatory and respiratory systems: Secondary | ICD-10-CM

## 2019-09-18 ENCOUNTER — Ambulatory Visit (INDEPENDENT_AMBULATORY_CARE_PROVIDER_SITE_OTHER): Payer: Medicare Other

## 2019-09-18 ENCOUNTER — Encounter (INDEPENDENT_AMBULATORY_CARE_PROVIDER_SITE_OTHER): Payer: Self-pay

## 2019-09-18 ENCOUNTER — Ambulatory Visit (INDEPENDENT_AMBULATORY_CARE_PROVIDER_SITE_OTHER): Payer: Medicare Other | Admitting: Vascular Surgery

## 2019-09-18 ENCOUNTER — Encounter (INDEPENDENT_AMBULATORY_CARE_PROVIDER_SITE_OTHER): Payer: Self-pay | Admitting: Vascular Surgery

## 2019-09-18 ENCOUNTER — Other Ambulatory Visit: Payer: Self-pay

## 2019-09-18 VITALS — BP 95/69 | HR 121 | Resp 20 | Ht 69.0 in | Wt 152.0 lb

## 2019-09-18 DIAGNOSIS — I10 Essential (primary) hypertension: Secondary | ICD-10-CM | POA: Diagnosis not present

## 2019-09-18 DIAGNOSIS — L819 Disorder of pigmentation, unspecified: Secondary | ICD-10-CM

## 2019-09-18 DIAGNOSIS — J449 Chronic obstructive pulmonary disease, unspecified: Secondary | ICD-10-CM

## 2019-09-18 DIAGNOSIS — I25118 Atherosclerotic heart disease of native coronary artery with other forms of angina pectoris: Secondary | ICD-10-CM | POA: Diagnosis not present

## 2019-09-18 DIAGNOSIS — R0989 Other specified symptoms and signs involving the circulatory and respiratory systems: Secondary | ICD-10-CM | POA: Diagnosis not present

## 2019-09-18 DIAGNOSIS — I70223 Atherosclerosis of native arteries of extremities with rest pain, bilateral legs: Secondary | ICD-10-CM | POA: Diagnosis not present

## 2019-09-18 DIAGNOSIS — E119 Type 2 diabetes mellitus without complications: Secondary | ICD-10-CM
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
## 2019-09-19 DIAGNOSIS — I739 Peripheral vascular disease, unspecified: Secondary | ICD-10-CM | POA: Insufficient documentation

## 2019-09-25 ENCOUNTER — Other Ambulatory Visit (INDEPENDENT_AMBULATORY_CARE_PROVIDER_SITE_OTHER): Payer: Self-pay | Admitting: Vascular Surgery

## 2019-09-25 DIAGNOSIS — I70223 Atherosclerosis of native arteries of extremities with rest pain, bilateral legs: Secondary | ICD-10-CM

## 2019-09-26 ENCOUNTER — Ambulatory Visit
Admission: RE | Admit: 2019-09-26 | Discharge: 2019-09-26 | Disposition: A | Discharge status: Home / Self Care | Payer: Medicare Other | Source: Ambulatory Visit | Attending: Vascular Surgery | Admitting: Vascular Surgery

## 2019-09-26 ENCOUNTER — Other Ambulatory Visit: Payer: Self-pay

## 2019-09-26 DIAGNOSIS — I70223 Atherosclerosis of native arteries of extremities with rest pain, bilateral legs: Secondary | ICD-10-CM | POA: Insufficient documentation

## 2019-09-26 LAB — POCT I-STAT CREATININE: Creatinine, Ser: 1.5 mg/dL — ABNORMAL HIGH (ref 0.61–1.24)

## 2019-09-26 MED ORDER — IOHEXOL 350 MG/ML SOLN
100.0000 mL | Freq: Once | INTRAVENOUS | Status: AC | PRN
  Administered 2019-09-26: 100 mL via INTRAVENOUS

## 2019-10-02 ENCOUNTER — Encounter (INDEPENDENT_AMBULATORY_CARE_PROVIDER_SITE_OTHER): Payer: Self-pay | Admitting: Vascular Surgery

## 2019-10-02 ENCOUNTER — Ambulatory Visit (INDEPENDENT_AMBULATORY_CARE_PROVIDER_SITE_OTHER): Payer: Medicare Other | Admitting: Vascular Surgery

## 2019-10-02 ENCOUNTER — Other Ambulatory Visit: Payer: Self-pay

## 2019-10-02 VITALS — BP 96/63 | HR 99 | Resp 16

## 2019-10-02 DIAGNOSIS — I25118 Atherosclerotic heart disease of native coronary artery with other forms of angina pectoris: Secondary | ICD-10-CM | POA: Diagnosis not present

## 2019-10-02 DIAGNOSIS — J449 Chronic obstructive pulmonary disease, unspecified: Secondary | ICD-10-CM

## 2019-10-02 DIAGNOSIS — E782 Mixed hyperlipidemia: Secondary | ICD-10-CM

## 2019-10-02 DIAGNOSIS — I10 Essential (primary) hypertension: Secondary | ICD-10-CM

## 2019-10-02 DIAGNOSIS — I739 Peripheral vascular disease, unspecified: Secondary | ICD-10-CM | POA: Diagnosis not present

## 2019-10-02 DIAGNOSIS — I70223 Atherosclerosis of native arteries of extremities with rest pain, bilateral legs: Secondary | ICD-10-CM | POA: Diagnosis not present
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
## 2019-10-05 ENCOUNTER — Encounter (INDEPENDENT_AMBULATORY_CARE_PROVIDER_SITE_OTHER): Payer: Self-pay | Admitting: Vascular Surgery

## 2020-02-06 DEATH — deceased
